# Patient Record
Sex: Male | Born: 1935 | Race: White | Hispanic: No | Marital: Married | Smoking: Never smoker
Health system: Southern US, Community
[De-identification: ages and names within clinical notes are randomized; demographics above are authoritative.]

## PROBLEM LIST (undated history)

## (undated) DIAGNOSIS — E039 Hypothyroidism, unspecified: Secondary | ICD-10-CM

## (undated) DIAGNOSIS — G4710 Hypersomnia, unspecified: Secondary | ICD-10-CM

## (undated) DIAGNOSIS — E785 Hyperlipidemia, unspecified: Secondary | ICD-10-CM

## (undated) DIAGNOSIS — R011 Cardiac murmur, unspecified: Secondary | ICD-10-CM

## (undated) DIAGNOSIS — R569 Unspecified convulsions: Secondary | ICD-10-CM

## (undated) DIAGNOSIS — I4891 Unspecified atrial fibrillation: Secondary | ICD-10-CM

## (undated) DIAGNOSIS — R413 Other amnesia: Secondary | ICD-10-CM

## (undated) DIAGNOSIS — G40209 Localization-related (focal) (partial) symptomatic epilepsy and epileptic syndromes with complex partial seizures, not intractable, without status epilepticus: Secondary | ICD-10-CM

## (undated) DIAGNOSIS — R233 Spontaneous ecchymoses: Secondary | ICD-10-CM

## (undated) DIAGNOSIS — R4701 Aphasia: Secondary | ICD-10-CM

## (undated) DIAGNOSIS — R238 Other skin changes: Secondary | ICD-10-CM

## (undated) DIAGNOSIS — M1990 Unspecified osteoarthritis, unspecified site: Secondary | ICD-10-CM

## (undated) DIAGNOSIS — R351 Nocturia: Secondary | ICD-10-CM

## (undated) DIAGNOSIS — R319 Hematuria, unspecified: Secondary | ICD-10-CM

## (undated) DIAGNOSIS — B004 Herpesviral encephalitis: Secondary | ICD-10-CM

## (undated) DIAGNOSIS — R0683 Snoring: Secondary | ICD-10-CM

## (undated) DIAGNOSIS — R410 Disorientation, unspecified: Secondary | ICD-10-CM

## (undated) HISTORY — PX: CATARACT EXTRACTION: SUR2

## (undated) HISTORY — DX: Hyperlipidemia, unspecified: E78.5

## (undated) HISTORY — DX: Other skin changes: R23.8

## (undated) HISTORY — DX: Unspecified osteoarthritis, unspecified site: M19.90

## (undated) HISTORY — DX: Other amnesia: R41.3

## (undated) HISTORY — DX: Spontaneous ecchymoses: R23.3

## (undated) HISTORY — DX: Hematuria, unspecified: R31.9

## (undated) HISTORY — DX: Cardiac murmur, unspecified: R01.1

## (undated) HISTORY — PX: PROSTATE SURGERY: SHX751

## (undated) HISTORY — DX: Herpesviral encephalitis: B00.4

## (undated) HISTORY — DX: Hypersomnia, unspecified: G47.10

## (undated) HISTORY — DX: Localization-related (focal) (partial) symptomatic epilepsy and epileptic syndromes with complex partial seizures, not intractable, without status epilepticus: G40.209

## (undated) HISTORY — DX: Disorientation, unspecified: R41.0

## (undated) HISTORY — DX: Unspecified convulsions: R56.9

## (undated) HISTORY — DX: Unspecified atrial fibrillation: I48.91

## (undated) HISTORY — DX: Aphasia: R47.01

---

## 1993-01-31 HISTORY — PX: JOINT REPLACEMENT: SHX530

## 1997-12-01 ENCOUNTER — Ambulatory Visit (HOSPITAL_COMMUNITY): Admission: RE | Admit: 1997-12-01 | Discharge: 1997-12-01 | Payer: Self-pay | Admitting: Internal Medicine

## 1998-01-16 ENCOUNTER — Ambulatory Visit (HOSPITAL_COMMUNITY): Admission: RE | Admit: 1998-01-16 | Discharge: 1998-01-16 | Payer: Self-pay | Admitting: *Deleted

## 2001-08-01 ENCOUNTER — Encounter (INDEPENDENT_AMBULATORY_CARE_PROVIDER_SITE_OTHER): Payer: Self-pay | Admitting: Specialist

## 2001-08-01 ENCOUNTER — Ambulatory Visit (HOSPITAL_COMMUNITY): Admission: RE | Admit: 2001-08-01 | Discharge: 2001-08-01 | Payer: Self-pay | Admitting: *Deleted

## 2003-02-03 ENCOUNTER — Ambulatory Visit: Admission: RE | Admit: 2003-02-03 | Discharge: 2003-02-03 | Payer: Self-pay | Admitting: Orthopedic Surgery

## 2003-03-03 ENCOUNTER — Inpatient Hospital Stay (HOSPITAL_COMMUNITY): Admission: RE | Admit: 2003-03-03 | Discharge: 2003-03-06 | Payer: Self-pay | Admitting: Orthopedic Surgery

## 2004-02-01 DIAGNOSIS — B004 Herpesviral encephalitis: Secondary | ICD-10-CM

## 2004-02-01 HISTORY — DX: Herpesviral encephalitis: B00.4

## 2004-02-26 ENCOUNTER — Inpatient Hospital Stay (HOSPITAL_COMMUNITY): Admission: EM | Admit: 2004-02-26 | Discharge: 2004-03-24 | Payer: Self-pay | Admitting: Emergency Medicine

## 2004-02-26 ENCOUNTER — Ambulatory Visit: Payer: Self-pay | Admitting: Internal Medicine

## 2004-02-26 ENCOUNTER — Ambulatory Visit: Payer: Self-pay | Admitting: Infectious Diseases

## 2004-03-01 ENCOUNTER — Ambulatory Visit: Payer: Self-pay | Admitting: Cardiology

## 2004-03-01 ENCOUNTER — Encounter: Payer: Self-pay | Admitting: Cardiology

## 2004-03-24 ENCOUNTER — Ambulatory Visit: Payer: Self-pay | Admitting: Physical Medicine & Rehabilitation

## 2004-03-24 ENCOUNTER — Inpatient Hospital Stay (HOSPITAL_COMMUNITY)
Admission: RE | Admit: 2004-03-24 | Discharge: 2004-04-02 | Payer: Self-pay | Admitting: Physical Medicine & Rehabilitation

## 2004-04-26 ENCOUNTER — Ambulatory Visit
Admission: RE | Admit: 2004-04-26 | Discharge: 2004-04-26 | Payer: Self-pay | Admitting: Physical Medicine & Rehabilitation

## 2004-04-26 ENCOUNTER — Ambulatory Visit (HOSPITAL_COMMUNITY)
Admission: RE | Admit: 2004-04-26 | Discharge: 2004-04-26 | Payer: Self-pay | Admitting: Physical Medicine & Rehabilitation

## 2004-05-07 ENCOUNTER — Encounter
Admission: RE | Admit: 2004-05-07 | Discharge: 2004-08-05 | Payer: Self-pay | Admitting: Physical Medicine & Rehabilitation

## 2004-05-10 ENCOUNTER — Encounter
Admission: RE | Admit: 2004-05-10 | Discharge: 2004-07-07 | Payer: Self-pay | Admitting: Physical Medicine & Rehabilitation

## 2004-05-11 ENCOUNTER — Ambulatory Visit: Payer: Self-pay | Admitting: Physical Medicine & Rehabilitation

## 2004-05-22 ENCOUNTER — Emergency Department (HOSPITAL_COMMUNITY): Admission: EM | Admit: 2004-05-22 | Discharge: 2004-05-23 | Payer: Self-pay | Admitting: Emergency Medicine

## 2004-07-09 ENCOUNTER — Ambulatory Visit: Payer: Self-pay | Admitting: Physical Medicine & Rehabilitation

## 2004-07-15 ENCOUNTER — Encounter: Admission: RE | Admit: 2004-07-15 | Discharge: 2004-07-15 | Payer: Self-pay | Admitting: Neurology

## 2006-03-27 ENCOUNTER — Ambulatory Visit (HOSPITAL_COMMUNITY): Admission: RE | Admit: 2006-03-27 | Discharge: 2006-03-27 | Payer: Self-pay | Admitting: Urology

## 2006-06-06 ENCOUNTER — Encounter (INDEPENDENT_AMBULATORY_CARE_PROVIDER_SITE_OTHER): Payer: Self-pay | Admitting: Specialist

## 2006-06-06 ENCOUNTER — Ambulatory Visit (HOSPITAL_COMMUNITY): Admission: RE | Admit: 2006-06-06 | Discharge: 2006-06-07 | Payer: Self-pay | Admitting: Urology

## 2006-09-01 ENCOUNTER — Inpatient Hospital Stay (HOSPITAL_COMMUNITY): Admission: AD | Admit: 2006-09-01 | Discharge: 2006-09-04 | Payer: Self-pay | Admitting: Neurology

## 2006-09-01 ENCOUNTER — Encounter: Payer: Self-pay | Admitting: Emergency Medicine

## 2006-09-27 IMAGING — CT CT HEAD W/O CM
1 of 2 series · 13 of 30 positions shown, 17 images · IV contrast (agent unspecified)
Comparison: none

CLINICAL DATA: confusion for two days; questionable TIAs; no previous history of seizure but did have a seizure today
 CT SCAN OF THE HEAD WITHOUT CONTRAST:
 A series of scans of the head without contrast show motion artifact.  No definite intracranial mass or hemorrhage is seen.  Ventricular system appears normal.  There is mild symmetrical atrophy.  Bone windows show no evidence of skull fracture or foreign body.  The paranasal sinuses appear to be within the limits of normal.  There appears to be a nasogastric tube in the right nasal passageway.

[Series 2: fast head · axial · 0.47mm/px · z∈[+145,+285]mm · 13 of 40 slices shown, 17 images]
[im 3/40  brain]
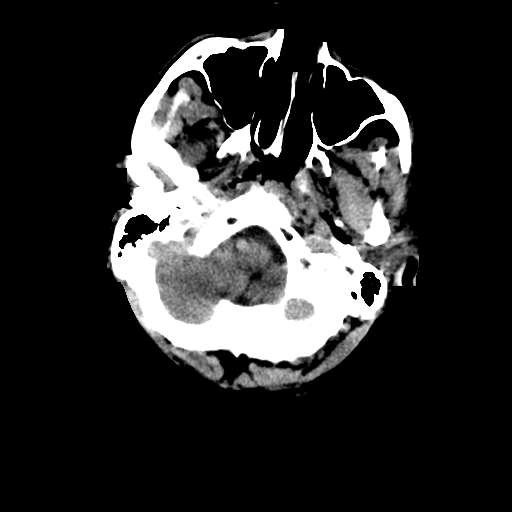
[im 3/40  bone]
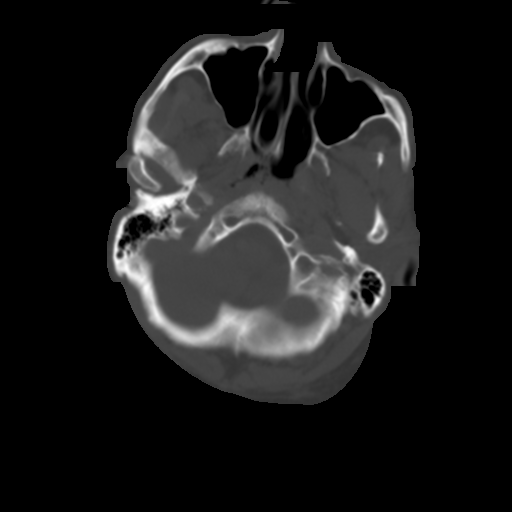
[im 6/40  brain]
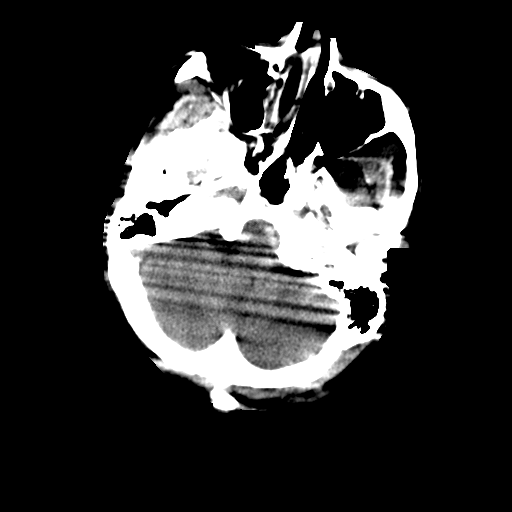
[im 9/40  brain]
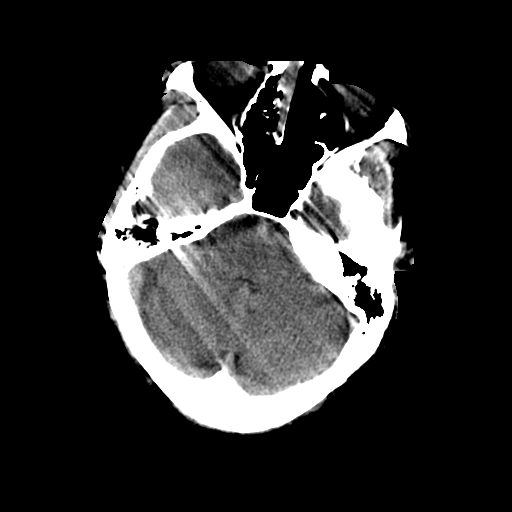
[im 12/40  brain]
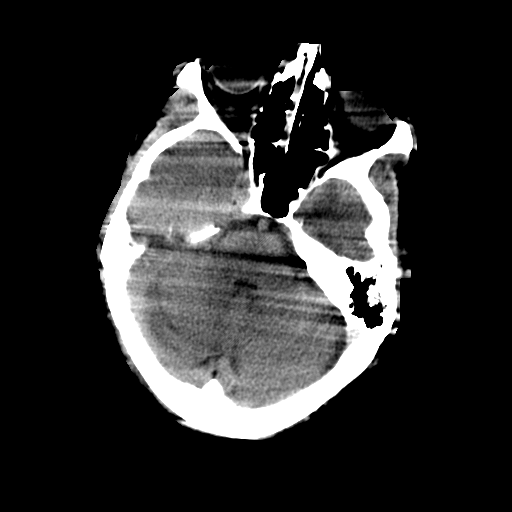
[im 14/40  brain]
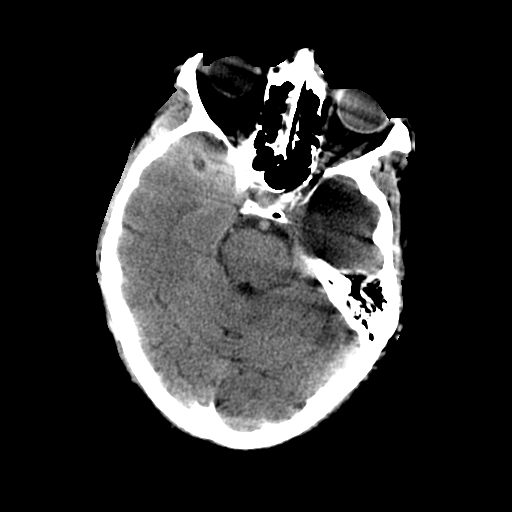
[im 14/40  bone]
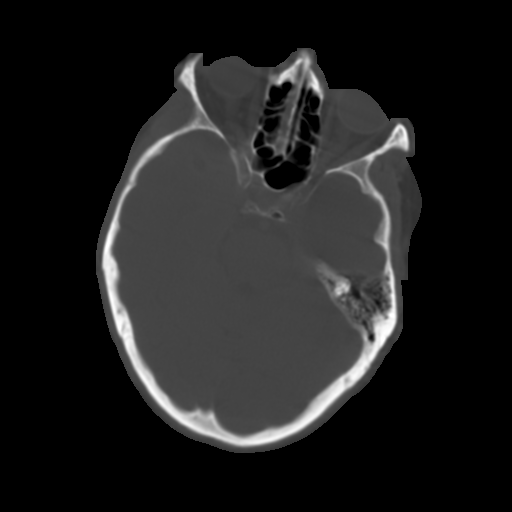
[im 17/40  brain]
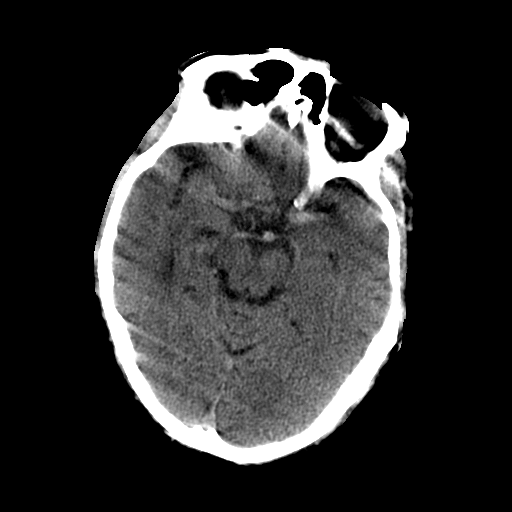
[im 20/40  brain]
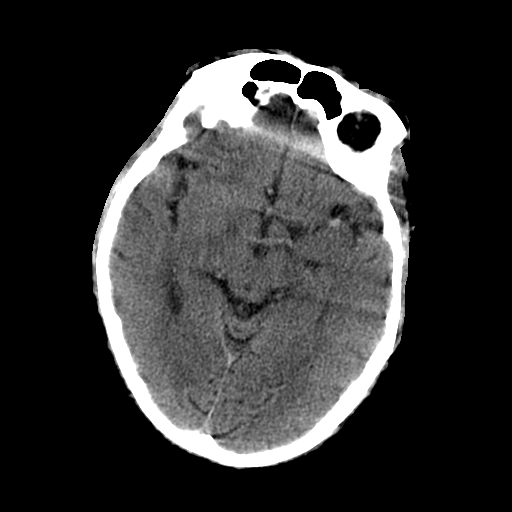
[im 23/40  brain]
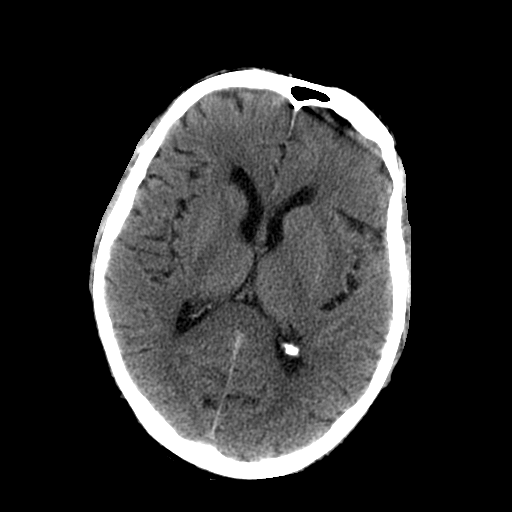
[im 26/40  brain]
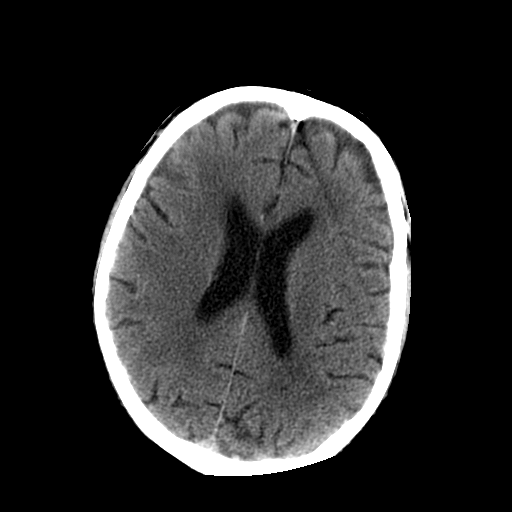
[im 26/40  bone]
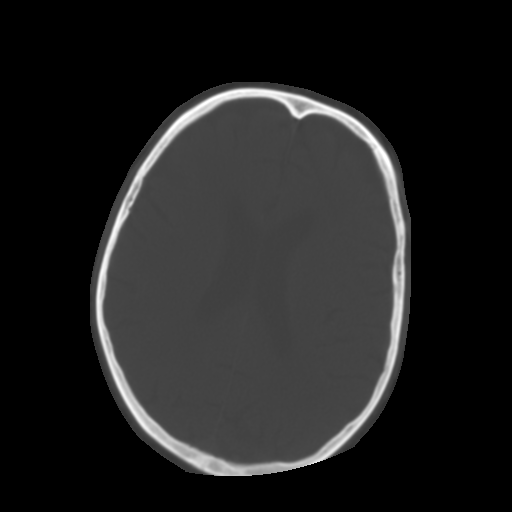
[im 28/40  brain]
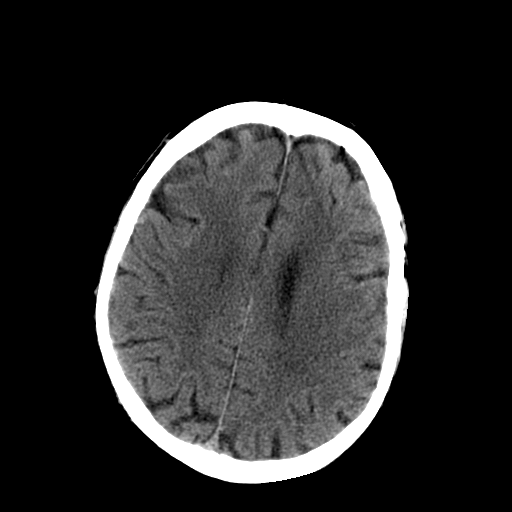
[im 31/40  brain]
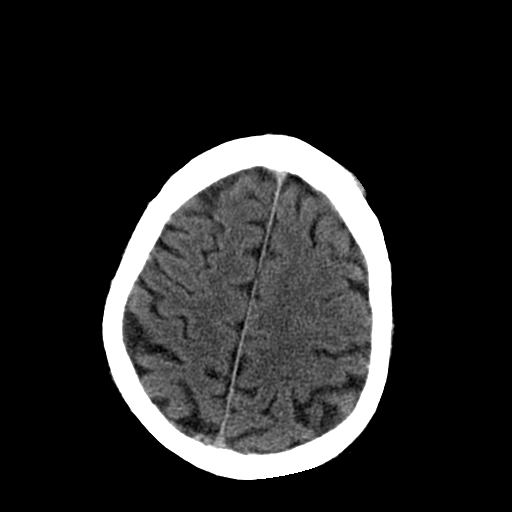
[im 34/40  brain]
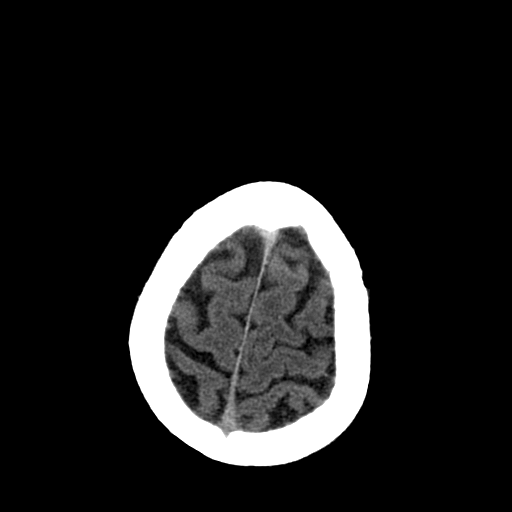
[im 37/40  brain]
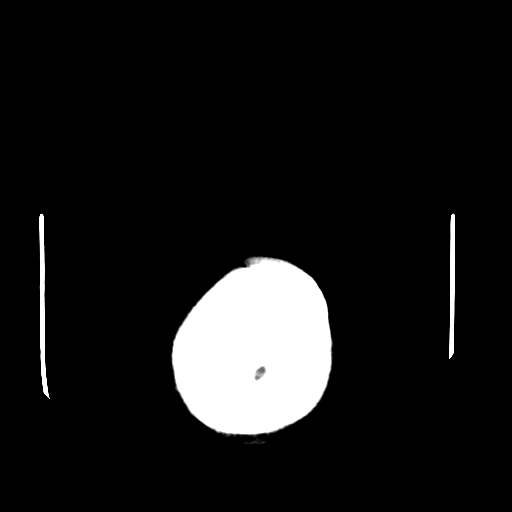
[im 37/40  bone]
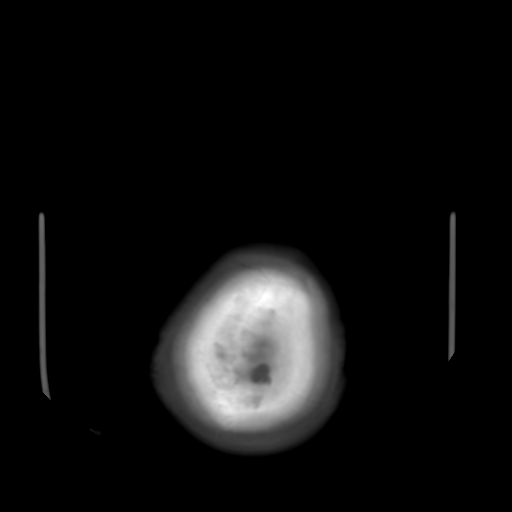

[13 of 30 positions shown; findings below may reference images not displayed]

IMPRESSION: Some motion artifact.  No acute intracranial findings.  Mild symmetrical atrophy.

## 2006-09-30 IMAGING — CT CT HEAD W/O CM
1 series · 16 of 30 positions shown, 20 images · IV contrast (agent unspecified)
Comparison: MR 02/27/2004.

CLINICAL DATA: Seizure.  
 HEAD CT WITHOUT CONTRAST:

[Series 2: trauma head · axial · 0.47mm/px · z∈[+119,+260]mm · 16 of 30 slices shown, 20 images]
[im 2/30  brain]
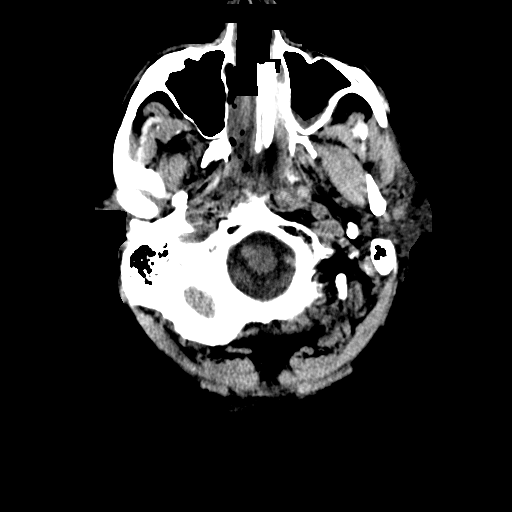
[im 2/30  bone]
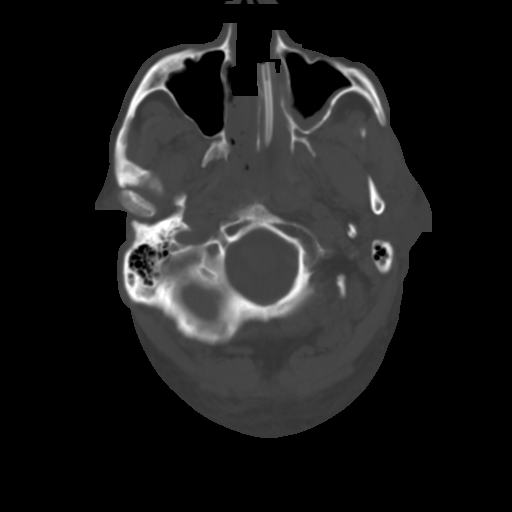
[im 4/30  brain]
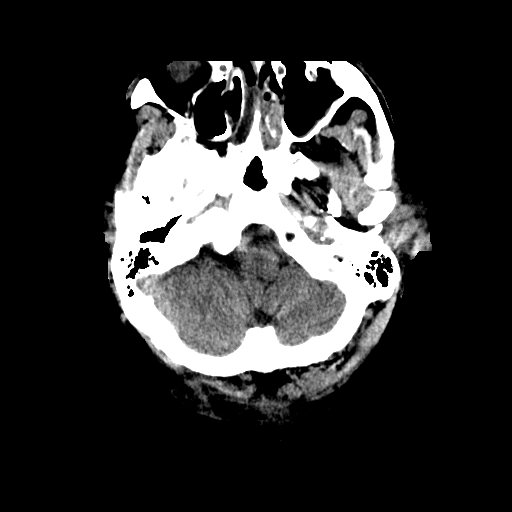
[im 6/30  brain]
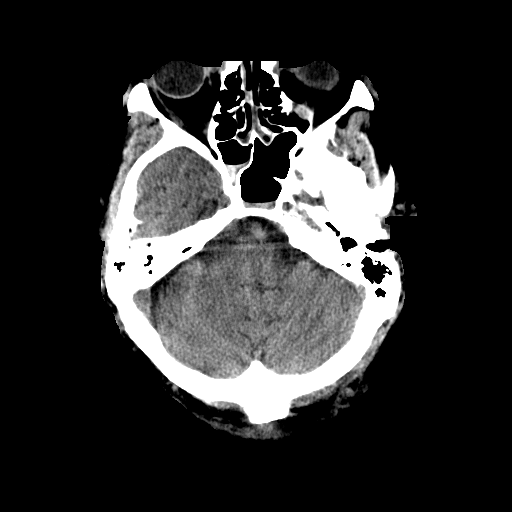
[im 8/30  brain]
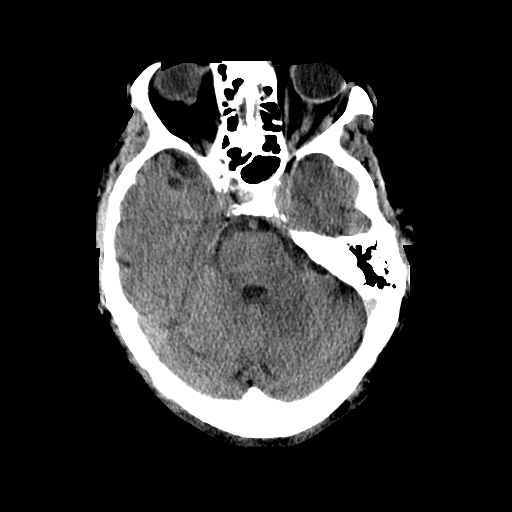
[im 9/30  brain]
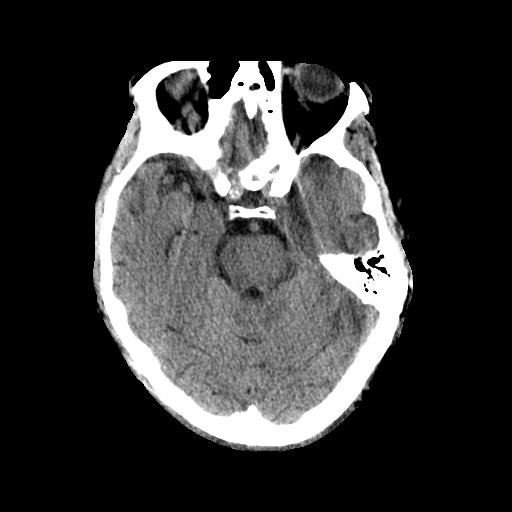
[im 9/30  bone]
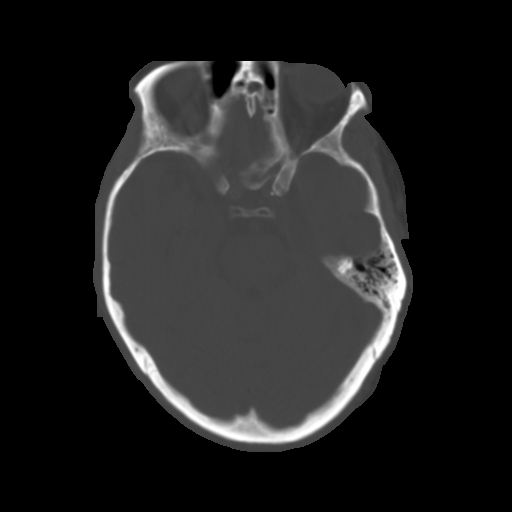
[im 11/30  brain]
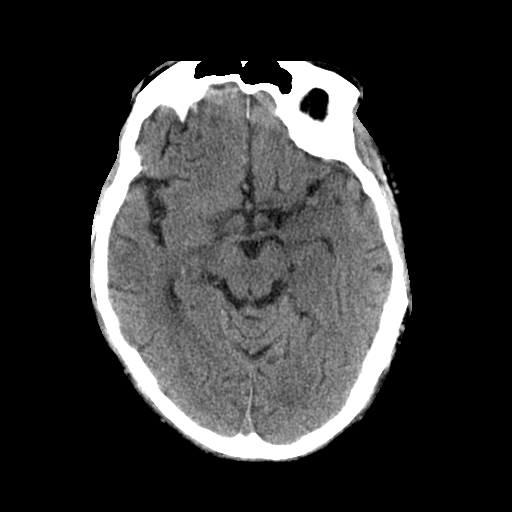
[im 13/30  brain]
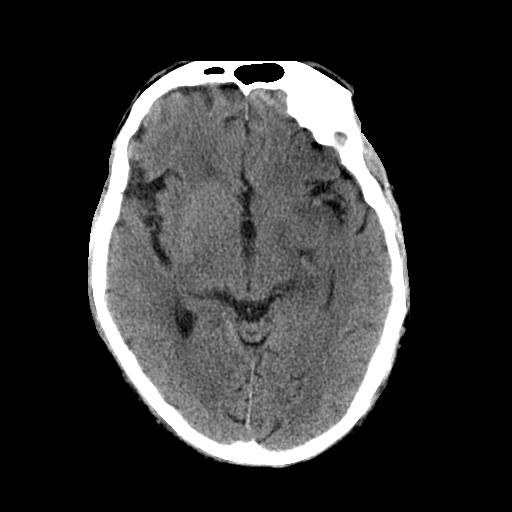
[im 15/30  brain]
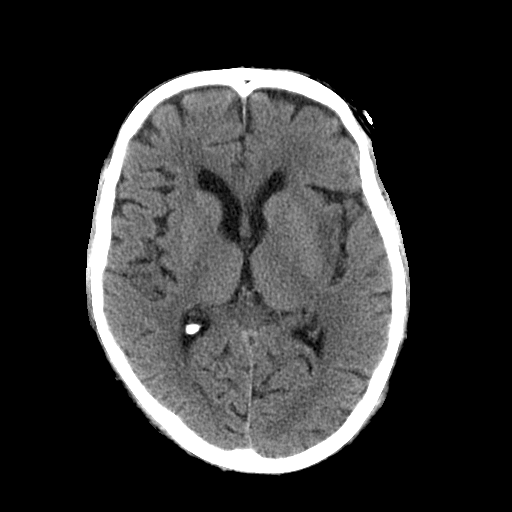
[im 16/30  brain]
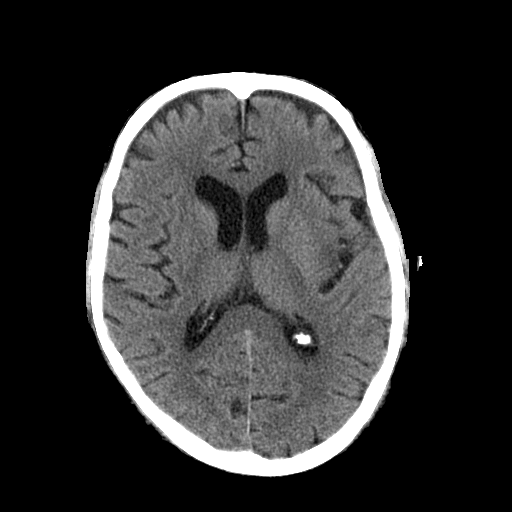
[im 16/30  bone]
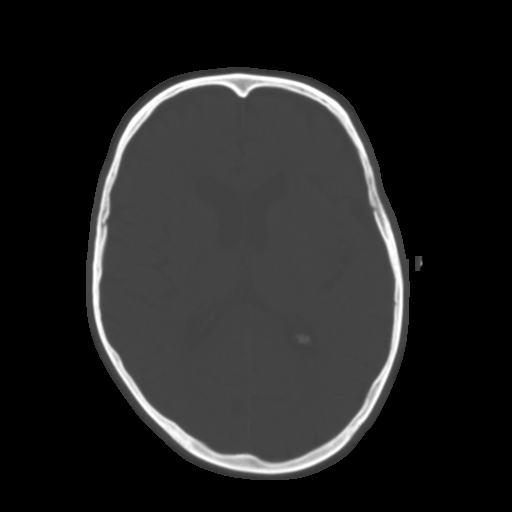
[im 18/30  brain]
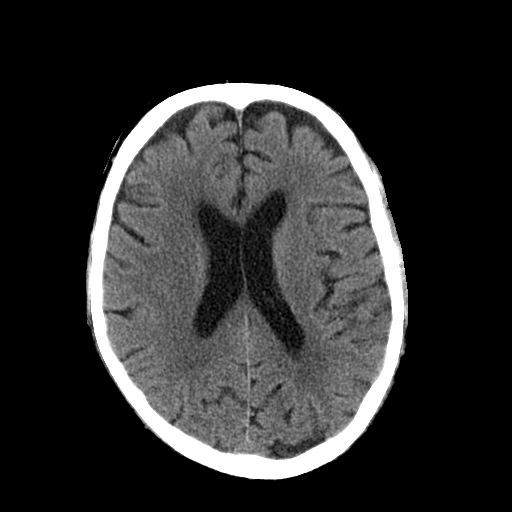
[im 20/30  brain]
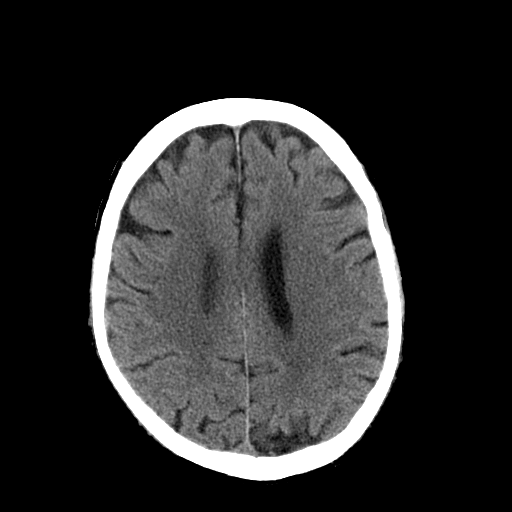
[im 22/30  brain]
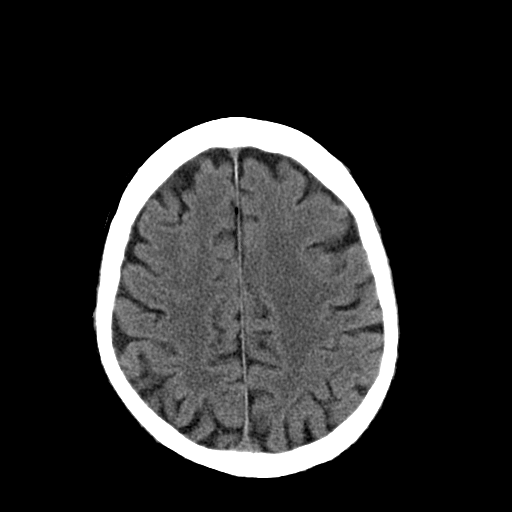
[im 23/30  brain]
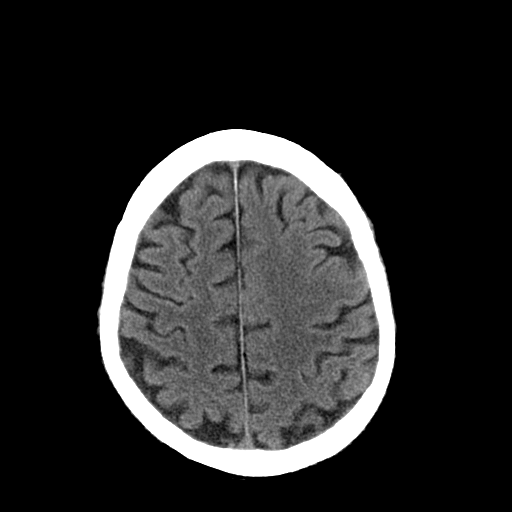
[im 23/30  bone]
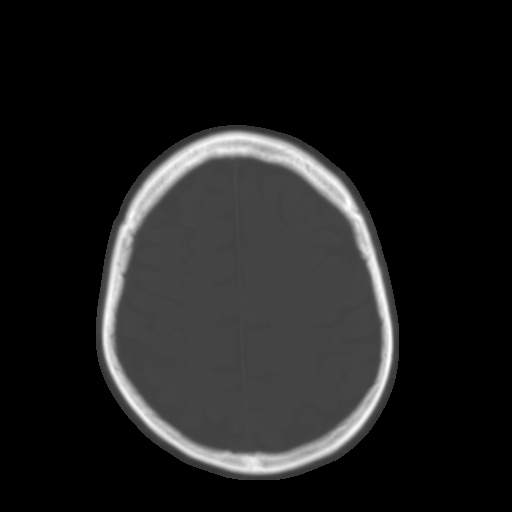
[im 25/30  brain]
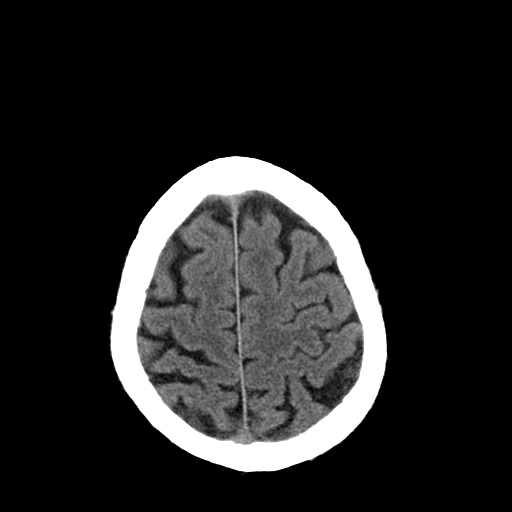
[im 27/30  brain]
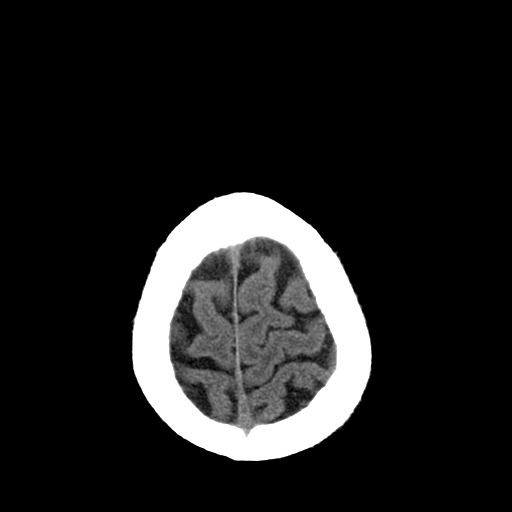
[im 29/30  brain]
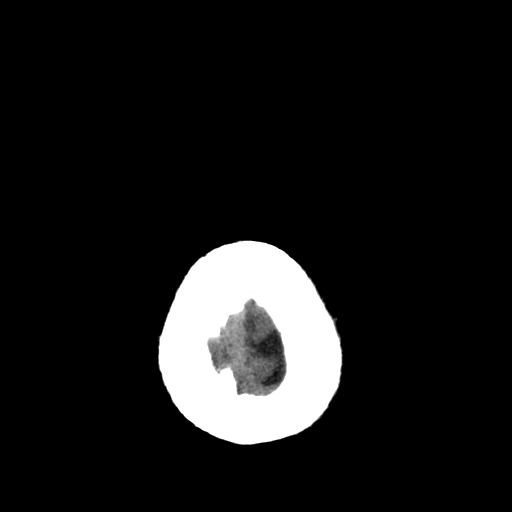

[16 of 30 positions shown; findings below may reference images not displayed]

FINDINGS: Low density in the medial left temporal lobe is again present.  Air fluid levels are noted in the sphenoid sinuses. There is no evidence of mass effect, midline shift or acute hemorrhage.  The ventricular system is within normal limits.
IMPRESSION: No significant interval change other than fluid in the sphenoid sinuses.  Edema in the medial left temporal lobe is stable.

## 2010-02-20 ENCOUNTER — Encounter: Payer: Self-pay | Admitting: Internal Medicine

## 2010-02-21 ENCOUNTER — Encounter: Payer: Self-pay | Admitting: Physical Medicine & Rehabilitation

## 2010-06-15 NOTE — Consult Note (Signed)
NAME:  Ronald Roberts, Ronald Roberts NO.:  [id number]   MEDICAL RECORD NO.:  [id number]          PATIENT TYPE:  INP   LOCATION:  3035                         FACILITY:  [facility]   PHYSICIAN:  Pramod P. Pearlean Brownie, MD    DATE OF BIRTH:  19-May-1922   DATE OF CONSULTATION:  DATE OF DISCHARGE:                                 CONSULTATION   ADDENDUM TO CONSULT NOTE:  Ronald Roberts came in with a seizure, was given 1.5 g of IV Fosphenytoin and  now following this the patient complained of nausea and was felt to be  dizzy and ataxic, and family was not comfortable taking him home and  requested that he be admitted for observation.  Hence the patient has  been transferred from a consult in the ER, then discharged home to  admission for observation.  I requested Dr. Otilio Carpen, ER physician, to  admit the patient as a direct admit to [facility] under my care.           ______________________________  Sunny Schlein. Pearlean Brownie, MD     PPS/MEDQ  D:  09/01/2006  T:  09/02/2006  Job:  [id number]

## 2010-06-15 NOTE — Consult Note (Signed)
NAME:  Ronald Roberts, FETCH NO.:  [id number]   MEDICAL RECORD NO.:  [id number]          Roberts TYPE:  EMS   LOCATION:  ED                           FACILITY:  [facility]   PHYSICIAN:  Pramod P. Pearlean Brownie, MD    DATE OF BIRTH:  [birth year]   DATE OF CONSULTATION:  DATE OF DISCHARGE:                                 CONSULTATION   REFERRING PHYSICIAN:  Dr. Valma Cava.   REASON FOR REFERRAL:  Seizure.   HISTORY OF PRESENT ILLNESS:  Mr. Ronald Roberts is an 77 year old pleasant  Caucasian male who had a witnessed generalized tonic-clonic seizure  earlier today at home.  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  Ronald  Roberts's wife, who was eyewitness, describes generalized tonic-clonic  activity lasting a few minutes with foaming at Ronald mouth as well as  tongue bite.  Ronald Roberts stopped seizing by Ronald time he was brought to  Ronald emergency room.  He has had no further seizures.  He was a little  bit confused for awhile but now is back to his neurological baseline.  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  At that time he was discharged on Depakote;  however, he remained seizure-free for almost a year and hence Depakote  was discontinued.  Ronald Roberts was seen by Dr. Orlin Hilding in our practice  for follow-up but has been seen only by Ronald primary physician for last  year and a half.  Ronald Roberts's wife is unable to recall any specific  triggers for Ronald present seizures in Ronald form of __________, ongoing  infection.  There is no other past significant history.   HOME MEDICATIONS:  None.   MEDICATION ALLERGIES:  Vancomycin.   SOCIAL HISTORY:  Ronald Roberts is married, lives with his wife in  [place].  Does not smoke or drink.   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  He needs assistance with most activities.  He  needs close supervision.  No recent fever, cough, shortness of breath,  diarrhea or other illness.   PHYSICAL EXAM:  A pleasant, elderly Caucasian male who is at present not  in distress.  Pulse rate 83 per minute, regular.  Respiratory rate 20 per minute.  Saturations 91% on room air.  Temperature 99.1.  Blood pressure 133/77.  Head is not traumatic.  Neck is supple without bruit.  ENT exam  unremarkable.  CARDIAC EXAM:  No murmur or gallop.  LUNGS:  Clear to auscultation.  ABDOMEN:  Soft, nontender.  NEUROLOGICAL EXAM:  Ronald Roberts is awake, alert, oriented x3 with normal  speech and language function.  There is no aphasia, apraxia or  dysarthria.  Pupils are equal, reactive, and movements are full-range  without nystagmus.  Ronald Roberts has receptive aphasia.  He is fluent  __________ simple conversation but  can follow only gestures and a few  one-step commands.  He is able to name and repeat quite well.  Face is  symmetric.  Palatal movements are normal.  Tongue is midline.  Motor  system exam reveals no upper extremity drift, symmetric strength, tone,  reflexes, coordination, sensation.  Gait was not tested.   DATA REVIEWED:  CT scan of Ronald head shows no acute abnormality.  WBC  count is normal.  Myoglobin is elevated but troponin is normal.   IMPRESSION:  An 77 year old gentleman with breakthrough generalized  tonic-clonic seizure following previous history of symptomatic seizures  from herpes simplex encephalitis.   PLAN:  I would load Ronald Roberts with IV fosphenytoin 20 mg/kg, followed  by 300 a day.  Check level in 10 days.  Ronald Roberts needs outpatient to  continue in follow-up with Dr. Orlin Hilding in a month as well as outpatient  EEG.  I had long discussion with Ronald Roberts's wife and daughter and  explained Ronald plan for treatment and answered questions.  I have advised  him to call  me if Ronald Roberts has further seizures or side effects from  Dilantin.           ______________________________  Sunny Schlein. Pearlean Brownie, MD     PPS/MEDQ  D:  09/01/2006  T:  09/01/2006  Job:  [id number]   cc:   Santina Evans A. Orlin Hilding, M.D.  Fax: [phone]   Antony Madura, M.D.  Fax: [phone]

## 2010-06-18 NOTE — Op Note (Signed)
NAME:  ROONEY, SWAILS NO.:  [id number]   MEDICAL RECORD NO.:  [id number]          PATIENT TYPE:  INP   LOCATION:  1824                         FACILITY:  [facility]   PHYSICIAN:  Gustavus Messing. Orlin Hilding, M.D.DATE OF BIRTH:  [birth year]   DATE OF PROCEDURE:  DATE OF DISCHARGE:                                 OPERATIVE REPORT   PROCEDURE:  Lumbar puncture.   ATTENDING:  Gustavus Messing. Orlin Hilding, M.D.   INDICATION:  Rule out meningitis.   INFORMED CONSENT:  Procedure was explained to the family and consent was  obtained.   DESCRIPTION OF PROCEDURE:  The patient was sterilely prepped and draped and  positioned in the left lateral decubitus position.  L4-5 level was  identified.  He was locally anesthetized with 1% Xylocaine.  The thecal sac  was entered without difficulty with an opening pressure of 19 Torr.  Approximately 12 mL of clear colorless fluid were obtained and sent to the  lab for studies, bacteria, cell count and protein and glucose, etc.  The  patient seemed to tolerate the procedure well with no immediate  complications.      CAW/MEDQ  D:  02/26/2004  T:  02/27/2004  Job:  [id number]

## 2010-06-18 NOTE — Consult Note (Signed)
NAME:  Ronald Roberts, Ronald Roberts NO.:  [id number]   MEDICAL RECORD NO.:  [id number]          PATIENT TYPE:  INP   LOCATION:  1824                         FACILITY:  [facility]   PHYSICIAN:  Gustavus Messing. Orlin Hilding, M.D.DATE OF BIRTH:  14-Nov-1922   DATE OF CONSULTATION:  02/26/2004  DATE OF DISCHARGE:                                   CONSULTATION   REFERRING PHYSICIAN:  Dr. Nyoka Cowden.   CHIEF COMPLAINT:  Seizure, fever, confusion.   HISTORY OF PRESENT ILLNESS:  Ronald Roberts is an 77 year old generally healthy  white male.  Four days ago, he complained of feeling chilled and unwell, no  fever.  Three days ago, he began having some speech difficulties with  deliberate slow speech, hard to get his thoughts out.  Two days ago, this  was worse and saw his primary physician.  He did seem confused as well as  having trouble speaking, otherwise a nonfocal exam.  Yesterday, he was worse  and he was scheduled to have an MRI of the brain today, but he got worse yet  today with speech problems and confusion, no weakness, but noted to be  quivery on the right hand and chin; his wife brought him to the emergency  room.  He had a generalized tonic-clonic seizure in triage and required  intubation.   REVIEW OF SYSTEMS:  Review of systems according to the wife only as above.  No complaints of headache, weakness or visual symptoms.   PAST MEDICAL HISTORY:  Past medical history is significant for:  1.  Right hip replacement.  2.  Old prostate surgery.  3.  He has a hernia that he treats with a truss.  4.  No coronary artery disease, no hypertension, no diabetes, no      hyperlipidemia.  5.  Possibly had some shingles in the past.  6.  He does have a rash on his abdomen currently.   MEDICATIONS:  None except for vitamins.   ALLERGIES:  No known drug allergies.   SOCIAL HISTORY:  He is married, still works as a Human resources officer, no  cigarettes, no alcohol.   FAMILY HISTORY:  His  family history is negative for seizure.   PHYSICAL EXAMINATION:  VITAL SIGNS:  Temperature is 100.2, respirations 18,  pulse 83, blood pressure 169/90.  HEENT:  Head is normocephalic, atraumatic.  NECK:  Neck is supple without any rigidity or bruits.  HEART:  Regular rate and rhythm.  NEUROLOGIC:  He has been intubated and sedated, but he will open his eyes,  nod his head, try to follow commands with all 4 extremities.  Cranial  nerves:  His pupils are equal and reactive.  He does blink to threat on both  sides.  Extraocular movements are intact.  There is no clear facial  asymmetry.  He is intubated.  Motor exam:  He does move all 4 extremities  spontaneously and to command.  Reflexes are 1+ with downgoing toes.  Coordination:  I am not able to assess.  Sensory:  He does grimace and  withdraw to painful stimulation.  IMAGING STUDIES:  CT of the brain was negative for acute abnormalities.   LABORATORIES:  White blood cell count is 13.3, otherwise fairly normal.  Potassium is slightly elevated at 5.7.  He does have a rash noted on his  abdomen prior to the institution of any medication such as Dilantin.   IMPRESSION:  Progressive confusion and language deficit for 4 days with  fever, now culminating with seizure; rule out encephalitis or meningitis.  I  am concerned about herpes encephalitis.   PLAN:  We will perform LP now, get an EEG and MRI with and without contrast.  We need to cover him with acyclovir and broad-spectrum antibiotics, and load  with Dilantin.      CAW/MEDQ  D:  02/26/2004  T:  02/27/2004  Job:  [id number]

## 2010-06-18 NOTE — Assessment & Plan Note (Signed)
MEDICAL RECORD NUMBER:  [id number].   HISTORY:  Ronald Roberts is an 77 year old male with a history of herpes  encephalitis and was admitted to [facility] February 26, 2004. Was treated with a four-day history of malaise, speech problems, and  mental status changes. He had a tonic/clonic seizure and required  intubation. He was loaded with Dilantin. He was switched to Depakote for  seizure prophylaxis. He was at rehab from March 24, 2004 to April 02, 2004. He was discharged to his family and has been receiving home health  therapy for four weeks and now has converted to speech therapy. He does have  24-hour supervision. His family states that he mainly has speech problems.  His wife initially had him on one quarter of a dosage of Depakote that was  prescribed, i.e. 125 q.i.d. He has had no seizure activity. They feel since  he has been taking the higher dose he has been a bit slower cognitively.   No falls.   CURRENT MEDICATIONS:  1.  Depakote as noted.  2.  MVI 1 p.o. q.d.   PAST MEDICAL HISTORY:  Significant for DJD.   No pain complaints.   PHYSICAL EXAMINATION:  VITAL SIGNS:  Blood pressure 149/88, pulse 71,  respirations 16, O2 saturation 96% on room air.  GENERAL:  No acute distress. Mood and affect appropriate; however, his  orientation resolved. He has severe naming problems. He also appears to  misinterpret speech but is able to follow commands with gestural cues. He is  able to name a pen but was unable to name ring or watch and tends to  perseverate on pen. He is able to repeat easily, and his speech is generally  fluent.   His motor strength is 5/5 bilateral upper and lower extremities. His gait is  slightly wide based but otherwise no evidence of toe drag or knee  instability. Cranial nerves II-XII are intact.   IMPRESSION:  Herpes encephalitis. Had dysphagia. This has really improved  back to a normal diet. Had gait imbalance, and this has improved as  well. No  falls and ambulates without a device. His main problems at this point are a  mixed receptive and expressive aphasia which I think is accounting for most  of his apparent cognitive problems.   PLAN:  1.  The patient will continue outpatient speech therapy.  2.  He will see Dr. Orlin Roberts who will address Depakote duration as well as      dosage. He will likely need a Depakote level when he sees Dr. Orlin Roberts.   I will see the patient back in one month to followup on therapy progress.      AEK/MedQ  D:  05/11/2004 11:13:44  T:  05/11/2004 11:50:54  Job #:  [id number]   cc:   Ronald Roberts, M.D.  [address]  [address]  [place]  [place] [address]  Fax: [phone]   Ronald Roberts, M.D.  [address]  [place]  [place] [address]  Fax: [phone]

## 2010-06-18 NOTE — Procedures (Signed)
CHIEF COMPLAINT:  Obtundation and coma. Study is being done to look for the  presence of seizures. The patient has had semirhythmic eye blinking.   PROCEDURE:  The tracing is carried out on a 32-channel digital Cadwell  recorder reformatted into 16-channel montages with one devoted to EKG. The  patient was lethargic and poorly responsive during this study. Medications  include insulin, Seroquel, Klonopin, Protonix, Dilantin, Depakote, Fortaz,  Ativan, Haldol, patient has been on acyclovir as well.   PROCEDURE:  Tracing is carried out on a 32-channel digital Cadwell recorder  portably in the intensive care unit. The International 10/20 system lead  placement used. This study was compared with the March 08, 2004 study.   DESCRIPTION OF FINDINGS:  The background is a 5 Hz 10-40 microvolt beta  range activity with superimposed polymorphic 40 microvolt 2-3 Hz delta range  components.   The patient drifts into sleep with vertex sharp waves and spindle-like  activity from 8-11 Hz in the central regions. There was no focal slowing.  There was no interictal epileptiform activity in the form of spikes or sharp  waves. I lead montages showed both eye blink artifact and also frontal  slowing. EKG showed a regular sinus rhythm with ventricular response of 96  beats per minute.   The background was nonreactive to eye opening.   In comparison with the March 08, 2004 record, there was higher amplitude,  greater diversity of frequency frequencies represented and also no  interictal seizure activity.      HQI:ONGE  D:  03/13/2004 09:07:26  T:  03/13/2004 19:29:45  Job #:  [id number]   cc:   Santina Evans A. Orlin Hilding, M.D.  [address]  [place]  [place] [address]  Fax: [phone]   Charlaine Dalton. Sherene Sires, M.D. [facility]

## 2010-06-18 NOTE — H&P (Signed)
NAME:  Ronald Roberts, Ronald Roberts NO.:  [id number]   MEDICAL RECORD NO.:  [id number]                   PATIENT TYPE:   LOCATION:                                       FACILITY:   PHYSICIAN:  Almedia Balls. Ranell Patrick, M.D.              DATE OF BIRTH:   DATE OF ADMISSION:  02/05/2003  DATE OF DISCHARGE:                                HISTORY & PHYSICAL   CHIEF COMPLAINT:  Right hip pain.   HISTORY OF PRESENT ILLNESS:  Ronald Roberts is a 77 year old male with a history  of right hip pain.  States that he is having pain on and off.  He is now at  the point where he wishes to have something definitively done to his right  hip because he it has decreased his activities of daily living.  X-rays  revealed severe osteoarthritis of the right hip.  At this point Dr. Ranell Patrick  feels it is best to proceed with a right total hip replacement and the  patient agrees.  Risks and benefits of the surgery were discussed with  patient.  The patient wishes to proceed.   PAST MEDICAL HISTORY:  1. Osteoarthritis.  2. BPH.   PAST SURGICAL HISTORY:  Transurethral resection of the prostate.   MEDICATIONS:  1. Aspirin one p.o. daily.  2. Glucosamine daily.   ALLERGIES:  No known drug allergies.   SOCIAL HISTORY:  The patient denies any tobacco or alcohol use.  He is  married.  He lives in a [place] house with two steps entering the house.  His wife will be his caregiver after surgery.   FAMILY HISTORY:  Father deceased with hypertension.   REVIEW OF SYSTEMS:  GENERAL:  Denies fever, chills, night sweats, bleeding  tendencies.  CNS:  Denies blurry/double vision, seizures, headaches,  paralysis.  RESPIRATORY:  Denies shortness of breath, productive cough, or  hemoptysis.  CARDIOVASCULAR:  Denies chest pain, angina, orthopnea.  GASTROINTESTINAL:  Denies nausea, vomiting, diarrhea, constipation, melena,  bloody stools.  GENITOURINARY:  Denies dysuria, hematuria, discharge.  MUSCULOSKELETAL:   Pertinent to HPI.   PHYSICAL EXAMINATION:  VITAL SIGNS:  Temperature 98.5, pulse 76,  respirations 18, blood pressure 114/66.  GENERAL:  Well-developed, well-nourished 77 year old male.  HEENT:  Normocephalic, atraumatic.  Pupils are equal, round, and reactive to  light.  NECK:  Supple.  No carotid bruit noted.  CHEST:  Clear to auscultation without rales, wheezes, crackles.  HEART:  Regular rate and rhythm.  No murmurs, rubs, or gallops.  ABDOMEN:  Soft, nontender, nondistended.  Positive bowel sounds x4.  EXTREMITIES:  The patient has limited internal rotation of the right hip.  He is able to flex to 90 degrees with pain of the right hip.  He walks with  a flat antalgic gait.  He is neurovascularly intact distally.  SKIN:  No rashes or lesions.   LABORATORIES:  X-ray reveals severe osteoarthritic  changes of the right hip.   IMPRESSION:  1. Osteoarthritis of the right hip.  2. Benign prostatic hypertrophy.   PLAN:  The patient will be admitted to [facility] on February 05, 2003 and undergo a right total hip arthroplasty by Dr. Malon Kindle.     Clarene Reamer, P.A.-C.                   Almedia Balls. Ranell Patrick, M.D.    SW/MEDQ  D:  01/30/2003  T:  01/30/2003  Job:  [id number]   cc:   Antony Madura, M.D.  [address]  [place]  [place] [address]  Fax: [phone]

## 2010-06-18 NOTE — Assessment & Plan Note (Signed)
MEDICAL RECORD NUMBER:  [id number]   DATE OF BIRTH:  [birth year]   DATE OF SERVICE:  Jun 10, 2004   INTERVAL HISTORY:  [age]-year-old male with history of herpes encephalitis  admitted to [facility] February 26, 2004.  He had a tonic  clonic seizure, required intubation, he was loaded with Dilantin, switched  to Depakote.  He was at rehab from February 22 to April 02, 2004 discharged  with his family receiving home health at first and then converted to  outpatient speech therapy.  He no longer has required PT/OT.  He has had his  Depakote adjusted.  He was noted to have a UTI in the month of April and was  treated for this.   PAST MEDICAL HISTORY:  Past medical history significant for DJD.   EXAMINATION:  GENERAL:  No acute distress.  Mood and affect appropriate.  Orientation is to self however, severe problems with naming tasks.  He  follows gestural cues, able to follow commands for physical exam but he is  unable to name simple objects such as ring, watch and perseverated on the  word [employer]x.  His speech is fluent and he is able to repeat.  His motor  strength is 5/5 bilateral upper and lower extremities.  Gait is slightly  widened but no evidence of toe drag or knee instability.  Cranial nerves II-  XII intact.   IMPRESSION:  Herpes encephalitis with dysphasia which has improved.  His  gait and balance has improved.  His main problem is mixed expressive and  receptive aphasia.   PLAN:  We will continue outpatient speech therapy.  I will see him in 1  month to monitor progress.      AEK/MedQ  D:  06/10/2004 17:31:29  T:  06/10/2004 21:28:13  Job #:  [id number]   cc:   Antony Madura, M.D.  [address]  [place]  [place] [address]  Fax: [phone]   Gustavus Messing. Orlin Hilding, M.D.  [address]  [place]  [place] [address]  Fax: [phone]

## 2010-06-18 NOTE — Procedures (Signed)
[facility]  Patient:    Ronald Roberts, Ronald Roberts Visit Number: [id number] MRN: [id number]          Service Type: END Location: ENDO Attending Physician:  Sabino Gasser Dictated by:   Sabino Gasser, M.D. Proc. Date: 08/01/01 Admit Date:  08/01/2001 Discharge Date: 08/01/2001   CC:         Antony Madura, M.D.   Procedure Report  PROCEDURE:  Colonoscopy with biopsy.  INDICATIONS:  Colon polyp by history in the past, removed by Dr. Su Hilt.  PREP:  Visicol tablets.  Result was very good.  ANESTHESIA:  Demerol 50, Versed 6 mg.  DESCRIPTION OF PROCEDURE:  With the patient mildly sedated in the left lateral decubitus position, a rectal exam was performed which was unremarkable. Subsequently, the Olympus videoscopic colonoscope was inserted into the rectum and passed under direct vision to the cecum, identified by the ileocecal valve and appendiceal orifice, both of which were photographed.  In the cecum was a polyp approximately 1 cm in size.  It was sessile; it was photographed, and it was removed using hot biopsy forceps technique setting of 20-20 blended current.  The tissue that was not removed was eradicated with the same setting.  From this point, the colonoscope was then slowly withdrawn, taking circumferential views of the entire colonic mucosa, stopping a few folds removed, where a similar-sized lipoma was seen and photographed only, and we further withdrew the colonoscope, taking circumferential views of the remaining colonic mucosa, stopping then only in the rectum which appeared normal on direct and showed hemorrhoids on retroflexed view.  The endoscope straightened and withdrawn.  The patients vital signs and pulse oximeter remained stable.  The patient tolerated the procedure well without apparent complications.  FINDINGS: 1. Polyp of cecum removed. 2. Await biopsy report. 3. The patient will call me for results and follow up with me as an  outpatient 4. Incidental finding of lipoma in the right colon as well.  PLAN:  As above. Dictated by:   Sabino Gasser, M.D. Attending Physician:  Sabino Gasser DD:  08/01/01 TD:  08/04/01 Job: [id number]

## 2010-06-18 NOTE — Op Note (Signed)
NAME:  Ronald Roberts, Ronald Roberts NO.:  [id number]   MEDICAL RECORD NO.:  [id number]                   PATIENT TYPE:  INP   LOCATION:  0453                                 FACILITY:  [facility]   PHYSICIAN:  Almedia Balls. Ranell Patrick, M.D.              DATE OF BIRTH:  July 28, 1922   DATE OF PROCEDURE:  03/03/2003  DATE OF DISCHARGE:                                 OPERATIVE REPORT   PREOPERATIVE DIAGNOSIS:  Right hip osteoarthritis.   POSTOPERATIVE DIAGNOSIS:  Right hip osteoarthritis.   OPERATION/PROCEDURE:  Right total hip replacement using DePuy S-ROM  prosthesis.   SURGEON:  Almedia Balls. Ranell Patrick, M.D.   ASSISTANT:  Donnie Coffin. Dixon, P.A.-C.   ANESTHESIA:  Spinal with MAC.   ESTIMATED BLOOD LOSS:  500 mL.   FLUIDS REPLACED:  2500 mL crystalloid.   URINE OUTPUT:  550 mL.   COMPLICATIONS:  None.   MEDICATIONS:  Preoperative antibiotics given.   INDICATIONS:  The patient is a 77 year old male who presents with end-stage  osteoarthritis of the right hip.  The patient complains of activity-related  pain including rest pain and night pain.  The patient's mobility has been  restricted secondary to his arthrosis as documented on plain x-ray.  The  patient has failed conservative management consisting of activity  modifications, anti-inflammatory medications over the course of several  years.  The patient now presents desiring total hip replacement.   DESCRIPTION OF PROCEDURE:  After adequate level of anesthesia achieved, the  patient was positioned in the left lateral decubitus position, the right hip  up.  After sterile prep and drape of the right leg, longitudinal skin  incision was created centered on the posterior two-thirds of trochanter  extending proximally, approximately 7-8 cm incision.  Dissection was carried  out sharply down through the subcutaneous tissues to tensor fascia lata  which was incised in line with the skin incision exposing the short rotators  which were taken off the posterior aspect of the hip capsule.  Gluteus  medius was retracted.  Once the capsule was exposed, the T-capsulotomy was  performed exposing the femoral head and neck.  The hip was progressively  internally rotated and dislocated.  At this point using the greater  trochanter as a reference for the hip center and a trial component, the neck  cut was made perpendicular to the long axis of the femur and appropriate  level of resection.  The head and neck was removed.  At this point attention  was directed towards the socket.  The labrum was removed with sharply from  the socket exposing the true acetabulum.  The ligament of teres was also  removed and all soft tissues removed from the fovea.  Sequential reaming was  performed up to a size 55 reamer to accept a 56 cup.  Excellent bite was  achieved with the 55 and again we medialized initially down onto the  tear  drop and then placed the reamer in the appropriate anteverted and horizontal  coverage position to where it was fully seated.  Reaming was done down to  the floor of the fovea and good bleeding bone was encountered all the way  around with good coverage.  At this point a 91 [address] [place]inn[place] cup was  impacted in place with appropriate version and coverage and then the  [place] anti version guide was placed into the socket, demonstrating  approximately 30 degrees of anteversion and 40 degrees horizontal coverage.  This was felt to be adequate.  A trial 0 liner was inserted and screwed into  place.  At this point the hip was extended and then internally rotated to  facilitate exposure of the femoral metaphysis.  Excess bone was removed on  the lateral side adjacent to the greater trochanter, facilitating access to  the intramedullary canal.  First a Star reamer was used to ream out that  lateral bone.  Next, a handheld T reamer was introduced into the  intramedullary canal and then thoroughly irrigated and  suctioned.  Next,  sequential reamers were used to ream up to a size 13.5 with excellent  purchase.  Next, the metaphyseal reamers were used and these were reamed up,  first to B and D size reamer.  This seemed to have appropriate anterior  posterior size and fit down to the appropriate level and greater trochanter.  Next, the neck reamer was used to ream out to a 18-D large and then the  trial sleeve was placed in the appropriate position.  The neck cut was  freshened up to make it perpendicular with the sleeve.  At this point the  trial 18.13, 36+8 stem was placed with 20 degrees of anteversion dialed in  and a 36 head ball.  At this point the hip was reduced.  Excellent stability  was noted and there was no impingement on external rotation and extension  nor on hip flexion and internal rotation with excellent stability.  At this  point the trial components were removed.  The 56 outer diameter, 36 inner  diameter polyethylene cup was inserted.  We had placed one screw up into the  ilium prior to that and also placed a manhole cover.  Next a 56 outer  diameter, 36 inner diameter polyethylene 0 neutral liner was impacted in  place.  Next, the 18-D large proximal sleeve was impacted into place and  then after thorough irrigation of the canal, the 18.13, 36+8 S-ROM  prosthesis was inserted with 20 degrees of anteversion and we did trial with  +3 head ball ___________ more femoral length and a little bit more soft  tissue tension.  This felt right with full trial.  This trial head was  removed and the +3 mm 36 outer diameter ball was impacted into place.  Hip  socket was checked for any soft tissue or debris.  None was encountered and  then the hip was relocated and taken through a full range of motion.  Excellent stability achieved.  The posterior capsule was repaired using  interrupted Ethibond suture, figure-of-eight.  This was #1 Ethibond. Following this, we replaced the piriformis back in  the gluteus medius tendon  adjacent to the greater trochanter.  Following this, the drain was placed  deep in the tensor fascia lata.  The tensor fascia lata was closed using  interrupted #1 Vicryl suture in a figure-of-eight fashion followed by 2-0  Vicryl for deep subcutaneous  tissue and superficial subcutaneous tissue and  a 4-0 running Monocryl for the skin.  Steri-Strips were placed followed by a  sterile dressing.  The patient was placed in a knee immobilizer and taken to  the recovery room in stable condition.                                               Almedia Balls. Ranell Patrick, M.D.    SRN/MEDQ  D:  03/03/2003  T:  03/03/2003  Job:  [id number]

## 2010-06-18 NOTE — Op Note (Signed)
NAME:  Ronald Roberts, Ronald Roberts NO.:  [id number]   MEDICAL RECORD NO.:  [id number]          PATIENT TYPE:  AMB   LOCATION:  DAY                          FACILITY:  [facility]   PHYSICIAN:  Excell Seltzer. Annabell Howells, M.D.    DATE OF BIRTH:  07-13-1922   DATE OF PROCEDURE:  06/06/2006  DATE OF DISCHARGE:                               OPERATIVE REPORT   PROCEDURE:  Transurethral resection of the prostate.   PREOPERATIVE DIAGNOSIS:  Benign prostatic hypertrophy with outlet  obstruction and hematuria.   POSTOPERATIVE DIAGNOSIS:  Benign prostatic hypertrophy with outlet  obstruction and hematuria.   SURGEON:  Dr. Bjorn Pippin.   ANESTHESIA:  Spinal.   SPECIMENS:  Prostate chips.   DRAIN:  22-French three-way Foley catheter.   COMPLICATIONS:  None.   INDICATIONS:  [name] is an 77 year old white male with a history of  BPH with prior TURP who has recently had increased voiding symptoms and  hematuria.  Thorough workup demonstrated some regrowth of prostatic  tissue with neovascularity and obstruction.   It was felt that TURP was indicated.   FINDINGS AND PROCEDURE:  The patient is given Cipro, was taken operating  room where spinal anesthetic was induced.  He was placed in lithotomy  position.  His perineum and genitalia were prepped Betadine solution.  He was draped in usual sterile fashion.  He urethra was calibrated to 35-  [place] with male sounds and 28-French continuous flow resectoscope  sheath was inserted without difficulty.  This was fitted with Wandra Scot  handle and prostate loop and 12 degree lens.  Inspection of the bladder  revealed no bladder lesions.  The ureteral orifices were unremarkable.  The prostatic urethra had evidence of prior resection with a large BPH  nodule regrowing from the right posterior lateral wall of the prostate  with associated neovascularity.   The prostatic regrowth was resected down to the capsular fibers.  Additional resection of the bladder  neck and floor was also performed to  ensure adequate patency.   At this point the bladder was evacuated free of chips.  Hemostasis was  assured.  Final inspection revealed no retained chips.  No active  bleeding and an intact external sphincter and ureteral orifice.  A 22-  French three-way Foley catheter was inserted with the aid of a catheter  guide after removal resectoscope.  The balloon was filled with  30 mL sterile fluid.  The catheter was irrigated until clear and placed  to continuous irrigation and straight drainage.  The specimen was sent  for permanent section.  The patient was taken down from lithotomy  position, removed to recovery room in stable condition.  There no  complications.      Excell Seltzer. Annabell Howells, M.D.  Electronically Signed     JJW/MEDQ  D:  06/06/2006  T:  06/06/2006  Job:  [id number]   cc:   Antony Madura, M.D.  Fax: [phone]

## 2010-06-18 NOTE — Assessment & Plan Note (Signed)
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  The last time I saw him on May 31, 2004, no longer receiving PT or  OT because of improvement in mobility.  He continued to receive speech  therapy, but this was discontinued last week because of lack of progress.   PAST MEDICAL HISTORY:  Significant for DJD.   PHYSICAL EXAMINATION:  GENERAL:  No acute distress.  Mood and affect  appropriate.  VITAL SIGNS:  Blood pressure 153/68, pulse 73, respiratory rate 16, O2  saturation 95% on room air.   He still has problems naming, calls a pen a thermos and watch spinach.  He  is able to repeat, however.   Finger-nose-finger testing in normal.  Strength is 5-/5 bilateral deltoids,  biceps, triceps, grips, hip flexion, knee extension, and dorsiflexion.   Gait is wide based but no evidence of toe drag or instability or evidence of  gait imbalance.   IMPRESSION:  Herpes encephalitis.  His main residual deficit is his aphasia  as well as cognitive deficits.   PLAN:  1.  He will follow up with neurology.  2.  Follow up with primary care.  3.  No further formal therapy needed.  I did go over that he may have some      spontaneous improvement in his speech and cognition over the next six      months.       AEK/MedQ  D:  07/12/2004 16:27:57  T:  07/12/2004 23:00:33  Job #:  [id number]   cc:   Santina Evans A. Orlin Hilding, M.D.  [address]  [address]  [place]  [place] [address]  Fax: [phone]   Antony Madura, M.D.  [address]  [place]  [place] [address]  Fax: [phone]

## 2010-06-18 NOTE — H&P (Signed)
NAME:  ROSHAUN, POUND NO.:  [id number]   MEDICAL RECORD NO.:  [id number]          PATIENT TYPE:  IPS   LOCATION:  4034                         FACILITY:  [facility]   PHYSICIAN:  Ranelle Oyster, M.D.DATE OF BIRTH:  1922/12/01   DATE OF ADMISSION:  03/24/2004  DATE OF DISCHARGE:                                HISTORY & PHYSICAL   CHIEF COMPLAINT:  Weakness, confusion, speech difficulties.   HISTORY OF PRESENT ILLNESS:  This is an 77 year old white male with history  of DJD, who was admitted with malaise, difficulty speaking on February 26, 2004.  The patient had a seizure in the emergency department, ultimately  requiring intubation and respiratory support.  MRI of the brain revealed  left temporal lobe increased signals with cortical edema and probable  encephalitis.  The patient's workup revealed his HSV PCR positive.  He was  started on acyclovir 14-day course.  The patient had continued fevers and  workup was positive for one of two blood cultures with Pseudomonas.  The  patient was on [place] through February 13.  The patient was eventually  trached on February 8, then extubated on February 11 and decannulated on  February 21.  The patient has had some ongoing problems with weakness as  well as confusion and generalized speech deficits.   REVIEW OF SYSTEMS:  Positive for diarrhea, decreased hearing, insomnia.   PAST HISTORY:  1.  Right total hip replacement.  2.  Prostate surgery.  3.  Hernia.   FAMILY HISTORY:  Noncontributory.   SOCIAL HISTORY:  The patient is married.  Lives in a one-level home with  supportive wife.  The patient was working as a Medical illustrator prior to admission.   FUNCTIONAL LEVEL:  The patient was independent prior to admission.  Apparently now he is supervision to minimum assist with mobility and total  assistance with ADLs and speech.   MEDICATIONS PRIOR TO ADMISSION:  Multivitamin daily.   ALLERGIES:  PROMOTE TUBE FEEDS,  VANCOMYCIN.   PHYSICAL EXAMINATION:  VITAL SIGNS:  The patient was afebrile, vital signs  are stable.  GENERAL:  General appearance is normal.  HEENT:  Pupils equal, round, and reactive to light.  Sclerae were slightly  red.  Oral mucosa was pink and moist.  NECK:  Trach stoma is closing nicely with minimal drainage.  Neck was supple  otherwise.  CHEST:  Clear to auscultation except for some mild crackles at the bases.  CARDIAC:  Regular rate and rhythm.  ABDOMEN:  Soft, nontender, bowel sounds positive.  MUSCULOSKELETAL:  The patient had good range of motion of both limbs.  Strength was 4+/5 in the right upper extremity, 4 to 4+/5 in the right lower  extremity.  Left side was 5/5.  Sensory exam may have been a bit diminished  on the right.  The patient had decreased fine motor coordination of the  right upper and lower extremities today.  NEUROLOGIC:  On further neurologic examination, cranial nerves II-XII were  intact.  Reflexes were normal.  The patient had impaired judgment.  Orientation was fair with cues.  Memory was poor.  Mood was generally  appropriate.  The patient had significant word-finding deficits and also  receptive language deficits as well.  He demonstrated word salad quite  frequently.  He had problems with simple one-step commands on many  occasions.   ASSESSMENT AND PLAN:  1.  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  Begin      comprehensive inpatient rehab.  Estimated length of stay is 10-14 days.      Prognosis is good.  Goals are supervision.  2.  Deep vein thrombosis prophylaxis with ambulation.  3.  Dysphagia:  Continue D1 honey-thick liquids.  Monitor fluid status.      Check BMET tomorrow.  4.  Seizure prophylaxis.  Continue Depakote for six months total.  5.  Anemia.  Add an iron supplement.  6.  Sleep.  Will monitor sleep-wake cycle.  Use p.r.n.  trazodone.      ZTS/MEDQ  D:  03/24/2004  T:  03/24/2004  Job:  [id number]

## 2010-06-18 NOTE — Discharge Summary (Signed)
NAME:  Ronald Roberts, GROFT NO.:  [id number]   MEDICAL RECORD NO.:  [id number]          PATIENT TYPE:  INP   LOCATION:  3003                         FACILITY:  [facility]   PHYSICIAN:  Charlaine Dalton. Sherene Sires, M.D. Romualdo Bolk OF BIRTH:  [birth year]   DATE OF ADMISSION:  02/26/2004  DATE OF DISCHARGE:                                 DISCHARGE SUMMARY   DATE OF ANTICIPATED DISCHARGE:  03/24/2004   DISPOSITION:  To [facility].   DISCHARGE DIAGNOSES:  1.  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  2.  Left lower lobe hospital-acquired pneumonia with positive pseudomonas      blood cultures.  3.  New onset seizure.  4.  Drug rash.  5.  Dysphagia.  6.  Debilitation.   PROCEDURES:  1.  Endotracheal tube placed 01/26, discontinued on 02/08.  2.  Tracheostomy tube placed by Dr. Pollyann Kennedy on 02/08, discontinued on      03/23/2004.  3.  Left radial A-line on 02/29/2004, discontinued.  4.  Central venous line placed 02/29/2004, discontinued 03/07/2004.  5.  Left PICC line placed 03/07/2004, discontinued 03/22/2004.   TESTS:  1.  EEG reviewed on 03/12/2004 negative for seizure activity.  2.  Compared with EEG on 02/27/2004 demonstrating a low-voltage amplitude      and a left frontal temporal slowing was negative for epileptiform      activity.  3.  CT of the head on 01/26 demonstrating no acute intracranial findings.      Mild symmetrical atrophy.  4.  MRI of the brain on 02/27/2004 demonstrating abnormal signal left      temporal lobe with cortical edema and mild enhancement.  5.  Differential diagnoses including encephalitis including herpes      encephalitis, though also to be postictal changes.  Barium swallow      obtained on 03/22/2004 demonstrating moderate oral and pharyngeal delay      of all consistencies. __________ penetration of nectar-thick barium was      demonstrated without aspiration.  6.  Last portable chest x-ray received on  03/15/2004 demonstrating mild      subsegmental atelectasis.  7.  Followup MRI of the brain on 03/08/2004, again, favoring herpes      encephalitis demonstrating progression in the extent of edema in the      left hippocampus, temporal lobe, and insular cortex and progression in      the amount of cortical enhancement seen in this area.  It was noted to      be a positive on diffusion weighted imaging.  There was no gross      hemorrhage.   BRIEF HISTORY:  This is a 77 year old white male with no significant past  medical history who presented to his primary care [name] on 02/24/2004  with increasing confusion and incomprehensible speech. He had been scheduled  for MRI of his head on 02/26/2004 by his primary care [name] with a  diagnosis of a TIA.  The morning of admission prior to the patient's  presentation the wife noticed the patient's speech becoming progressively  worse.  He also became confused and agitated.  The patient's primary care  [name] was contacted and he was brought to the emergency room.  On arrival  in the emergency room the patient had expressive aphasia with a witnessed  focal, then grand mal seizure. He was taken to CT scan.  While the patient  was in CT scanning he had increased work of breathing and respirations in  the 40s-50s and was electively intubated for airway protection.   PROBLEM LIST BY DISCHARGE DIAGNOSIS:  Problem #1:  VENTILATORY DEPENDENT  RESPIRATORY FAILURE SECONDARY TO SEIZURE AND MAINTENANCE OF AIRWAY.  The  patient, as previous noted was intubated on 02/26/2004 for the above stated  reason. He was maintained on mechanical ventilation throughout his acute  phase of illness.  He was begun on the weaning protocol on 03/11/2004 and  was liberated from mechanical ventilation after placement of a tracheostomy  on 03/11/2004.  His respiratory status continued to improve with the  decannulation of the trache performed on 03/23/2004.  The patient  is now  breathing without dyspnea on room air with breath sounds clear and a chest x-  ray demonstrating mild atelectasis.   Problem #2:  LEFT LOWER LOBE PNEUMONIA.  Thought to be vent dependent  diagnosed on 03/06/2004; also with positive 2 out of 4 blood cultures  positive for pseudomonas.  Infectious disease was following for this. The  patient was treated with a full course of Fortaz successfully.  The Elita Quick  has since been discontinued on 03/15/2004.   Problem #3:  HERPES ENCEPHALITIS.  Neurology was consulted after witness of  seizure and patient was placed on anticonvulsant therapy as well as having a  CT of the head obtained.  CT of head and MRI suggesting possible herpes  encephalitis.  The patient was treated successfully with a 14-day course of  acyclovir for this.  Followup EEG was negative for epileptic activity.  The  patient is currently on Depakote for antiepileptic therapy with neurology  recommending followup Depakote levels on the week of the 27th.  The patient  suffered acute delirium from the herpes encephalitis; however, following his  successful treatment he has had a rapid improvement in his neurological  exam.  He continues to have expressive aphasia; however, has made dramatic  improvement in his loss of consciousness and functional ability.  Given the  rate of his rapid improvement he is felt to be a strong rehab candidate.   Problem #4:  DYSPHAGIA.  The patient has undergone 2 swallowing evaluations,  the last one being on 03/22/2004 demonstrating moderate oropharyngeal  dysphagia with laryngeal penetration to the vocal cords with nectar-thick  liquids.  The patient did not have penetration to the lung.  Given these  findings, it was felt that the patient could safely tolerate a nectar thick  diet with aspiration precautions strictly followed.   Problem #5:  DEBILITATION.  The patient has been undergoing physical therapy and rehab; however, due to his  prolonged medical convalescence he will  require extensive and aggressive inpatient rehab.   Problem #6:  RASH, DRUG DEPENDENT.  The patient did develop acute rash and  fever during the acute course of his illness.  Infectious disease was  following it, at this time. It was felt by our infectious disease specialist  that this was drug-induced and the vancomycin and Dilantin were both  discontinued and the rash quickly went away.  During this time it was  assumed that this was  a vancomycin-induced rash; however, Dilantin may also  be the potential cause.   MEDICATION LIST:  1.  The patient will go on albuterol 2.5 mg inhaled q.6h.  2.  Depakote 500 mg p.o. q.6h.  3.  Pepcid 20 mg p.o. q.12h.  4.  Sliding scale insulin, NovoLog.  5.  Tylenol 650 mg p.o. q.4h. p.r.n.  6.  Thicket, instant food thickener, to obtain nectar-thick meals.   DIET:  To be nectar thick with full dysphagia/aspiration precautions.   ACTIVITY:  As tolerated per physical medication and rehab.   FOLLOWUP:  The patient will be discharged to acute physical rehab at [facility].  Call for any pulmonary issues.   DISPOSITION:  Upon discharge the patient's vital signs are stable.  He is  afebrile.  His heart rate is 74, respiratory rate is 20, blood pressure  158/78, blood glucose stable at 104-115.  He is alert and oriented to person  and place.  He clearly exhibits some expressive and some mild receptive  aphasia.  Requiring verbal cueing to follow commands.  He does move all  extremities with no clear focal deficits.  Respirations are clear.  Heart  tones are normal.  He has no edema.  He is tolerating his dysphagia diet.  His last white blood cell count is 6.9, hemoglobin 11.8, platelets 327.  Sodium 135, potassium 4, chloride 103, CO2 27, glucose 96, BUN 23,  creatinine 1.0.  Valproic acid 53.1.  This was obtained on 03/18/2004.  The  CBC and blood chemistry, just mentioned, were on 03/22/2004.      PB/MEDQ   D:  03/23/2004  T:  03/23/2004  Job:  [id number]   cc:   Santina Evans A. Orlin Hilding, M.D.  [address]  [address]  [place]  [place] [address]  Fax: [phone]   Cliffton Asters, M.D.  [address] [place]  [place] [address]  Fax: [phone]   Jeannett Senior. Pollyann Kennedy, MD  [id number] [place]  [place] [address]  Fax: [phone]   Ellwood Dense, M.D.  [address] [place]  [place] [address]  Fax: [phone]   Charlaine Dalton. Sherene Sires, M.D. [facility]

## 2010-06-18 NOTE — Discharge Summary (Signed)
NAME:  Ronald Roberts, Ronald Roberts NO.:  [id number]   MEDICAL RECORD NO.:  [id number]          PATIENT TYPE:  IPS   LOCATION:  4034                         FACILITY:  [facility]   PHYSICIAN:  Erick Colace, M.D.DATE OF BIRTH:  1922/03/27   DATE OF ADMISSION:  03/24/2004  DATE OF DISCHARGE:  04/02/2004                                 DISCHARGE SUMMARY   DISCHARGE DIAGNOSES:  1.  Herpes encephalopathy.  2.  Dysphagia.  3.  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  4.  Urinary retention, resolved.   HISTORY OF PRESENT ILLNESS:  Ronald Roberts is an 77 year old male with a history  of DJD, otherwise in good health, admitted on February 26, 2004, with a three  to four-day history of malaise, speech problems, question of TIA event.  He  had been set up for MRI by LMD on an outpatient basis, however, he was  brought to the ED, as the patient had worsening symptoms.  The patient was  noted to have tonic-clonic seizure while in triage requiring intubation.  CT  of head was done and negative.  Dr. Orlin Hilding was consulted for input on the  patient and recommended LP and EEG, that were done.  MRI of the brain done  showed abnormal signal in the left temporal lobe with cortical edema and  enhancement with question of herpes encephalitis.  The patient was started  on Acyclovir as well as antibiotics and loaded with Dilantin for seizure  prevention.  ID was consulted for input and recommended Acyclovir x14 days  if CSF, HSV, and PC are positive.  The patient continued with fevers  requiring multiple antibiotics.  Most recent Pseudomonas bacteremia was  treated with Elita Quick.  The patient required a trach placement on March 10, 2004, by Dr. Pollyann Kennedy.  He was extubated without difficulty on March 13, 2004, and the cannula on March 23, 2004.  The patient's encephalopathy  was slowly improving with clearing of mentation.  He has had some problems  with expressive aphasia.  He was also noted to  have dysphagia with  penetration with nectar-thick liquids, currently on pureed honey-thick  liquids.  The patient was changed over to Depakote for seizure prophylaxis.  Therapies were initiated, and the patient had close supervision for bed  mobility, min to contact guard for transfers, min to contact guard assist  for ambulating greater than 250 feet, moderate to total assist for ADLs.   PAST MEDICAL HISTORY:  Significant for right total hip replacement, prostate  surgery, hernial repair.   ALLERGIES:  ProMod tube feeds, vancomycin.   FAMILY HISTORY:  Noncontributory.   SOCIAL HISTORY:  The patient is married.  He is lives in a one level home  with one to two steps at entry.  He was independent and working prior to  admission.  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  He does not use  any tobacco or alcohol.   HOSPITAL COURSE:  Ronald Roberts was admitted to rehab on March 24, 2004, for inpatient therapies to consist of PT/OT daily.  The  patient was  maintained on Depakote for seizure prophylaxis.  Dr. Orlin Hilding, who has  followed up on the patient, recommended continuing Depakote for six months.  She will reevaluate the patient at that time.  Secondary to the patient's  dysphagia, labs were checked at past admission, and this revealed a  hemoglobin of 12.6, hematocrit 36.4, white count 6.4, platelets 256,000.  Sodium 137, potassium 3.7, chloride 105, CO2 26, BUN 21, creatinine 1.1,  glucose 101.  UA was checked, and the patient's urine was noted to be  positive.  He was started on amoxicillin initially, however, as the  patient's urine grew out greater than 100,000 colonies of Pseudomonas, he  was changed over to Cipro and continued on this for seven total days of  antibiotic therapy.  The patient's diet was noted to be poor and D1.  A  repeat MBS was done on March 26, 2004, and D3 trials were initiated.  As  the patient was able to tolerate this, he was initially  advanced to D3 honey-  thick liquids.  He has been able to tolerate this without difficulty.  The  patient's fluid intake has been poor secondary to his dislike of honey-thick  liquids.  He was noted to have some problems with concentrated urine and was  treated with IV fluids to help with hydration.  This was discontinued on  March 31, 2004.  The patient was started on water protocol, and the family  has been instructed regarding water protocol to help with hydration status.  Repeat check of fluid status prior to discharge showed a BUN 23, creatinine  1.2.  The patient is to continue pushing fluids post discharge.  Repeat BMP  on April 08, 2004, with results to Dr. Su Hilt.   During his stay at rehab, Ronald Roberts balance improved.  He continues with  decreased memory and poor safety awareness.  He is currently at supervision  level for bed mobility, supervision level for transfers, min guard assist  without assistive device in an unsupervised environment.  He is able to  navigate 250 feet indoors with close supervision with min cuing.  He  requires supervision to navigate stairs.  He is currently at set up assist  for upper body care, supervision with min cuing for low body care.  Speech  therapy has been following him along, and a repeat swallow was done prior to  discharge, however, the patient was noted to continue with __________  dysphagia with aspiration of thin and penetration of nectar; he remains on  honey-thick liquids currently.  The patient continues with fluent aphasia.  Word-finding:  He is noted to have neologisms, which are used as  compensatory strategy secondary to his word-finding difficulty.  He does  tend to get easily distracted requiring redirection to tasks.  The patient  will require 24-hour supervision secondary to safety risks secondary to  aphagia.  Advised to provide 24-hour supervision post discharge.   DISCHARGE MEDICATIONS: 1.  Depakote sprinkles 125 mg p.o.  q.i.d.  2.  Trinsicon one p.o. b.i.d.  3.  Multivitamin one per day.  4.  Cipro 250 mg p.o. b.i.d.   DIET:  Soft honey-thick liquids.   ACTIVITY:  With supervision.   FOLLOWUP:  The patient is to follow up with Dr. Orlin Hilding for an appointment  in four weeks.  Follow up with Dr. Wynn Banker on 05/11/04.  Follow up with Dr.  Burton Apley in the next couple of weeks.  Home health services with  PT/OT/speech therapy.   SPECIAL INSTRUCTIONS:  No alcohol, no smoking, no driving.  Open Depakote  sprinkle tab and administer in applesauce or pudding.  Continue pushing  fluids to maintain hydration status.  Follow up with me on 04/26/04.      PP/MEDQ  D:  04/02/2004  T:  04/03/2004  Job:  [id number]   cc:   Antony Madura, M.D.  [address]  [place]  [place] [address]  Fax: [phone]   Gustavus Messing. Orlin Hilding, M.D.  [address]  [address]  [place]  [place] [address]  Fax: [phone]

## 2010-06-18 NOTE — Discharge Summary (Signed)
NAME:  Ronald Roberts, Ronald Roberts NO.:  [id number]   MEDICAL RECORD NO.:  [id number]          PATIENT TYPE:  INP   LOCATION:  4741                         FACILITY:  [facility]   PHYSICIAN:  Pramod P. Pearlean Brownie, MD    DATE OF BIRTH:  1922/03/22   DATE OF ADMISSION:  09/01/2006  DATE OF DISCHARGE:  09/04/2006                               DISCHARGE SUMMARY   ADMISSION DIAGNOSIS:  Seizure.   DISCHARGE DIAGNOSES:  1. Dilantin toxicity.  2. New onset generalized seizure.  3. Herpes simplex and cephalitis with significant aphasia.   HOSPITAL COURSE:  Ronald Roberts is an 77 year old pleasant, Caucasian male  who was brought to the [facility] emergency room by his wife following  an episode of nausea and seizure at home.  The patient was loaded with  IV fosphenytoin for treatment of seizures.  The initial plan was to  discharge the patient home; however, when he tried to get up he was  found to be significantly ataxic and a little nauseous following the  dilantin load.  He was subsequently admitted to [facility]  directly to the floor.  His dilantin level was found to be elevated, and  so dilantin was initially held.  Subsequently, he was started on low  dose of 200 mg a day.  He was seen by physical therapy for gait  imbalance, was found to be stable to be discharged to home in the care  of his wife and daughter.  He remained subsequently aphasic on the day  of admission, but this was old from his dysphasia from encephalitis from  May 2006.  He was advised to follow up with his neurology, Dr. Orlin Hilding,  follow up in their office in a month, and with his cardiologist, Dr.  Allyson Sabal, as needed.  Arrangements were made for home physical therapy.  The patient was seen in consultation during hospitalization by  outpatient cardiology, and was felt to be stable for discharge.   DISCHARGE MEDICATIONS:  1. Dilantin 100 mg 2 tablets daily.  2. Multivitamin once a day.  3. Glucosamine tablet  twice a day.   Plans were made for home PT/OT.           ______________________________  Sunny Schlein. Pearlean Brownie, MD     PPS/MEDQ  D:  09/28/2006  T:  09/28/2006  Job:  [id number]   cc:   Santina Evans A. Orlin Hilding, M.D.  Nanetta Batty, M.D.

## 2010-06-18 NOTE — Procedures (Signed)
TECHNICAL DESCRIPTION:  This EEG was recorded in an unresponsive intubated  patient.  The background activity shows low voltage fast beta activity with  some focal slowing in the left temporal region.  Intermittent sharp waves  are noted on the left but no definite epileptiform activity is seen and  those phase reversals are present.  Photic stimulation and hyperventilation  testing were not performed.   IMPRESSION:  This is an abnormal EEG showing evidence of low voltage  amplitude and left frontotemporal slowing.  No epileptiform activity is  noted on this study.      ZOX:WRUE  D:  02/27/2004 16:09:17  T:  02/28/2004 10:21:02  Job #:  [id number]

## 2010-06-18 NOTE — Discharge Summary (Signed)
NAME:  Ronald Roberts, Ronald Roberts NO.:  [id number]   MEDICAL RECORD NO.:  [id number]                   PATIENT TYPE:  INP   LOCATION:  0453                                 FACILITY:  [facility]   PHYSICIAN:  Almedia Balls. Ranell Patrick, M.D.              DATE OF BIRTH:  02/26/1922   DATE OF ADMISSION:  03/03/2003  DATE OF DISCHARGE:  03/06/2003                                 DISCHARGE SUMMARY   j   ADMISSION DIAGNOSIS:  Right hip osteoarthritis.   DISCHARGE DIAGNOSES:  1. Right hip osteoarthritis with right total hip arthroplasty.  2. Postoperative hyponatremia, improved.   PROCEDURE:  Ronald Roberts was taken to the operating room at [facility] on  March 03, 2003, where a right total hip arthroplasty using a DePuy S-ROM  prosthesis was used. Surgeon was Dr. Malon Kindle. First assistant was Dr.  _____________, and second assistant Alphonsa Overall, PA-C.  Anesthesia was spinal  with MAC. Blood loss was 500 cc. Fluids replaced was 2500 crystalloid. Urine  output was 550 ml.  Perioperative antibiotics were given per protocol. There  were no complications with the surgery.   BRIEF HISTORY:  Ronald Roberts is a 77 year old male who presented with end-stage  osteoarthritis to his right hip. The patient complained of activity related  pain including rest pain and night pain. The patient's mobility had been  restricted secondary to his arthrosis as documented on plain x-rays.  The  patient failed conservative management consisting of activity modification,  anti-inflammatory medications. However, over the course of several years,  the patient now presents desiring a total hip replacement.   LABORATORY DATA:  Upon admission CBC from Ronald Roberts showed a hemoglobin of  12.9, hematocrit 36.9. First day postoperative, H&H was 11.9/34.6  respectively and on day of discharge the H&H was 11.9 and 34.7. Chemistries  on admission, none were obtained. The first day postoperatively the patient  was noted  to have a sodium of 132, and glucose of 252. Upon discharge, the  hyponatremia had resolved and sodium was 138 and glucose was 121.  All other  chemistries were normal throughout the entire course postoperatively and  through the day of discharge. PT-INR preoperatively was 14.7 and 1.2  respectively. The first day postoperative, his PT was 18.0, INR 1.8. On the  day of discharge, PT was 18.8 and INR was 1.9. No specimens were obtained  during surgery and no gram stain cultures taken.   HOSPITAL COURSE:  The patient was admitted to [facility], taken to  the OR, and underwent the above-stated procedure without any complications.  The patient tolerated the procedure extremely well and was later transferred  to the post anesthesia care unit where he spent an adequate amount of time  for recovery and was transferred up to room 453 at [facility]. The patient  was put on a Dilaudid PCA pump which the patient states  he used quite  infrequently throughout his stay. On postoperative day one, the patient  states he was doing quite well. He states he had a little bit of nausea over  the night, especially after he used a little bit of Dilaudid, but the nausea  resolved with some Phenergan p.o. The patient was able to take p.o.  medications by the end of postoperative day one without difficulty, really  not complaining about any pain at all.  On postoperative day two, the  patient states he was doing extremely well, desires to have Foley removed,  and also the IVs taken down, which we have completed. The patient was able  to take p.o. fluids very well and p.o. medications. The patient was also out  of bed to chair and to bathroom with assistance with walker and knee  immobilizer without difficulty. On postoperative day three, rehab  consultation states that patient would probably do quite well with home  therapy versus rehab stay. Therefore, they were recommending home physical  therapy. The  patient was also released from meeting all goals regarding  occupational therapy and physical therapy by postoperative day three.  Therefore, discussion with the patient, the patient desired to be discharged  and discussion with family agreed that the patient was ready for discharge.  Home health was set up and the patient was discharged on postoperative day  three which was on March 06, 2003.   DISCHARGE PLANNING:  The patient was discharged home on March 06, 2003.   DISCHARGE MEDICATIONS:  Coumadin, Percocet, and Robaxin.   DIET:  Regular diet.   FOLLOWUP:  The patient is to call [phone] and to call the office for an  appointment in seven to ten days.   ACTIVITY:  Toe-touch weightbearing as tolerated and PT and home health  nursing to monitor PT and INR.   DISPOSITION:  Home.   CONDITION ON DISCHARGE:  Stable and improving.     Thomas B. Durwin Nora, P.A.                     Almedia Balls. Ranell Patrick, M.D.    TBD/MEDQ  D:  03/06/2003  T:  03/06/2003  Job:  [id number]

## 2010-06-18 NOTE — Procedures (Signed)
CLINICAL HISTORY:  An 77 year old male with fever and confusion.   Medication listed Seroquel, Klonopin, Protonix, Tylenol, Lasix, Dilantin,  Zosyn, albumin, Ativan.   Background awake rhythm consists of 8-9 Hz  of diminished amplitude, it is  admixed with high frequency low amplitude beta activity seen intermittently  throughout the tracing which may be medication effect.  Intermittent 6-7 Hz  theta slowing is seen throughout the recording.  There is intermittent left  central alpha activity seen throughout the recording.  No paroxysmal  epileptiform activity, spikes or sharp waves are seen.  Length of the  tracing is 21.5 minutes.  Technical component is suboptimal with excessive  artifact seen throughout the tracing.  Activation procedures were not  performed.  Sleep stages I and II are seen.  EKG tracing reveals regular  sinus rhythm.   IMPRESSION:  This EEG performed during sleep states is abnormal due to  presence of mild bihemispheric dysfunction as well as focal left hemispheric  cortical irritability.  No definite epileptiform features were however,  seen.      ZOX:WRUE  D:  03/08/2004 19:09:31  T:  03/08/2004 20:24:19  Job #:  [id number]

## 2010-11-15 LAB — POCT CARDIAC MARKERS
Myoglobin, poc: >500
Operator id: 1627
Operator id: 1627
Troponin i, poc: <0.05

## 2010-11-15 LAB — URINALYSIS, ROUTINE W REFLEX MICROSCOPIC
Bilirubin Urine: NEGATIVE
Glucose, UA: NEGATIVE
Ketones, ur: NEGATIVE
Leukocytes, UA: NEGATIVE
pH: 6.5

## 2010-11-15 LAB — COMPREHENSIVE METABOLIC PANEL
CO2: 24
Calcium: 8.6
Creatinine, Ser: 1.21
GFR calc Af Amer: >60
GFR calc non Af Amer: 57 — ABNORMAL LOW
Glucose, Bld: 102 — ABNORMAL HIGH

## 2010-11-15 LAB — URINE MICROSCOPIC-ADD ON

## 2010-11-15 LAB — CBC
HCT: 41.9
Hemoglobin: 14.2
Hemoglobin: 14.5
MCHC: 33.8
MCHC: 34.2
MCV: 90.8
MCV: 91.6
Platelets: 185
RBC: 4.67
RDW: 12.9

## 2010-11-15 LAB — BASIC METABOLIC PANEL
BUN: 13
BUN: 17
CO2: 25
Chloride: 109
Creatinine, Ser: 1.32
GFR calc non Af Amer: >60
Glucose, Bld: 101 — ABNORMAL HIGH
Glucose, Bld: 160 — ABNORMAL HIGH
Potassium: 3.4 — ABNORMAL LOW

## 2010-11-15 LAB — DIFFERENTIAL
Lymphocytes Relative: 9 — ABNORMAL LOW
Lymphs Abs: 0.6 — ABNORMAL LOW
Neutrophils Relative %: 84 — ABNORMAL HIGH

## 2010-11-15 LAB — PHENYTOIN LEVEL, FREE AND TOTAL: Phenytoin Bound: 19.8

## 2010-11-15 LAB — CULTURE, BLOOD (ROUTINE X 2)

## 2010-11-15 LAB — MAGNESIUM: Magnesium: 2.4

## 2010-12-07 ENCOUNTER — Ambulatory Visit (INDEPENDENT_AMBULATORY_CARE_PROVIDER_SITE_OTHER): Payer: Medicare Other | Admitting: General Surgery

## 2010-12-07 ENCOUNTER — Encounter (INDEPENDENT_AMBULATORY_CARE_PROVIDER_SITE_OTHER): Payer: Self-pay | Admitting: General Surgery

## 2010-12-07 VITALS — BP 138/88 | HR 60 | Temp 96.2°F | Resp 16 | Ht 64.0 in | Wt 180.2 lb

## 2010-12-07 DIAGNOSIS — K4090 Unilateral inguinal hernia, without obstruction or gangrene, not specified as recurrent: Secondary | ICD-10-CM

## 2010-12-07 DIAGNOSIS — K4030 Unilateral inguinal hernia, with obstruction, without gangrene, not specified as recurrent: Secondary | ICD-10-CM

## 2010-12-07 NOTE — Progress Notes (Signed)
Subjective:     Patient ID: Ronald Roberts, male   DOB: 25-May-1922, 77 y.o.   MRN: [id number]  HPIMr. 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 The patient the patient says he has nocturia 2-3 times at night patient's wife says is usually twice and that he is followed by Ronald Roberts and Dr. rim and also noted that the hernia was quite large the patient's last colonoscopy was greater than 5 years ago and he has had a polyp removed 2 years ago by Ronald Roberts and requested that he have her followup barium white last week 3 years ago but the patient declined. 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 but he tries to minimize any physical weakness   Review of SystemsNo Known Allergies Current Outpatient Prescriptions  Medication Sig Dispense Refill  . DILANTIN 100 MG ER capsule       . levothyroxine (SYNTHROID, LEVOTHROID) 50 MCG tablet        Past Surgical History  Procedure Date  . Prostate surgery [phone] and 2006  . Joint replacement 1995    right hip replacement   The patient denies any chest pain high blood pressure or cardiac issues and he doesn't have any episodes of bloating cramping abdominal pain like he's had partial colon obstruction  the patient's wife says his bowels are usually quite regular but on examination today he got a large amount of fairly firm stool in his colon expected he is not to evacuate the colon H. time with his hernia is large.      Objective:   Physical Exam BP 138/88  Pulse 60  Temp(Src) 96.2 F (35.7 C) (Temporal)  Resp 16  Ht 5\' 4"  (1.626 m)  Wt 180 lb 4 oz (81.761 kg)  BMI 30.94 kg/m2 Patient is an elderly male appears his stated age and is oriented to time place and person he talks with count of some difficulty in the is difficult to get a direct answer but I did not come around the back answer. His lungs good breath sounds bilaterally her cardiac normal sinus rhythm abdomen soft there is no actual masses like constipation and then used a football-sized hernia in the left groin I'm not able to manually reduce I don't appreciate a weakness at the umbilicus the right inguinal area and rectal examination was a moderate amount of firm stool in the rectum that is Hemoccult negative I don't appreciate any neurological problems and he is not got swollen lower extremities. His CNS appears to be more amenable speech impairment and actually a standard all extremities and functions.     Assessment:     Patient has a very large  This probably check colon within it and I could not reduce the herniI would recommend that we get a CAT n of the abdomen and pelvis andI'll also follow Dr. Raenette Roberts and as he appears that he certainly having nocturia 2-3 times a night and hopefully the patient will hav problems of urinary retention afterwards. The patient is not on any type of tions for his prostate and difficulty voiding    Plan:     He CT of the abdomen pelvis and schedule a talk with Ronald Roberts and we will see the patient back after his x-rays the flexor schedule him for surgery his recent him blood work is good white count of 1009 hemoglobin of 43 serum electrolytes are normal BUN of 22 and a creatinine of 1.1 we will  be doing his surgery as an inpatient at [facility] with general anesthesia her and I would recommend that the patient start taking Ronald Roberts hopefully when I see him the next visit hoping to reduce the hernia as I do not appreciate evidence of obstructive type: Symptoms from the patient or his wife

## 2010-12-10 ENCOUNTER — Ambulatory Visit
Admission: RE | Admit: 2010-12-10 | Discharge: 2010-12-10 | Disposition: A | Discharge status: Home / Self Care | Payer: Medicare Other | Source: Ambulatory Visit | Attending: General Surgery | Admitting: General Surgery

## 2010-12-10 DIAGNOSIS — K4090 Unilateral inguinal hernia, without obstruction or gangrene, not specified as recurrent: Secondary | ICD-10-CM

## 2010-12-10 MED ORDER — IOHEXOL 300 MG/ML  SOLN
100.0000 mL | Freq: Once | INTRAMUSCULAR | Status: AC | PRN
  Administered 2010-12-10: 100 mL via INTRAVENOUS

## 2010-12-15 ENCOUNTER — Ambulatory Visit (INDEPENDENT_AMBULATORY_CARE_PROVIDER_SITE_OTHER): Payer: Medicare Other | Admitting: General Surgery

## 2010-12-15 ENCOUNTER — Encounter (INDEPENDENT_AMBULATORY_CARE_PROVIDER_SITE_OTHER): Payer: Self-pay | Admitting: General Surgery

## 2010-12-15 VITALS — BP 134/68 | HR 72 | Temp 96.3°F | Resp 20 | Ht 63.0 in | Wt 178.2 lb

## 2010-12-15 DIAGNOSIS — K4090 Unilateral inguinal hernia, without obstruction or gangrene, not specified as recurrent: Secondary | ICD-10-CM

## 2010-12-15 NOTE — Progress Notes (Signed)
Addended by: Iona Coach on: 12/15/2010 02:33 PM   Modules accepted: Orders

## 2010-12-15 NOTE — H&P (Signed)
Patient ID: Ronald Roberts, male   DOB: 04/09/1922, 77 y.o.   MRN: [id number]  HPI [name] returns is 77-year-old patient of Dr. Ron Robert is at the very large left inguinal hernia was not able to reduce last week for saw the patient and I did get a CT of the abdomen and pelvis there's no bladder and hernia but there was a very large gland and the patient was extremely constipated and I started him on MiraLax which she's been taken down for about 5 days was able to reduce the hernia today course it comes back I very quickly after he stands and the patient states that he would like to go ahead and proceed on getting a hernia repair but we will wait until after Thanksgiving. The patient will will be at least an overnight stay and possibly tonight planning to do general anesthesia her blood we will laminal placement of Foley catheter since his hernias are large and a brother that her catheter will be removed that day over the next morning and will be decided postoperatively I want the patient to continue with the MiraLax one or 2 times a day until his herniorrhaphy and we will also let him start a liquid diet to 3 days prior to the actual surgical repair so that there is no way that he is constipated. There was no blood in his stool it's been 5 or 6 years since these had a colonoscopy but I think it would be fine to proceed on with a hernia repair without any focal testing of his colon  Review of Systems Past Surgical History Procedure Date . Prostate surgery [id number] and 2006 . Joint replacement 1995   right hip replacement   No Known Allergies Current Outpatient Prescriptions Medication Sig Dispense Refill . DILANTIN 100 MG ER capsule      . levothyroxine (SYNTHROID, LEVOTHROID) 50 MCG tablet           Objective:  Physical ExamBP 134/68  Pulse 72  Temp(Src) 96.3 F (35.7 C) (Temporal)  Resp 20  Ht 5' 3" (1.6 m)  Wt 178 lb 4 oz (80.854 kg)  BMI 31.58 kg/m2 On physical examination today the  patient is a less confused course his bed in the previous and with his AMS issues from his previous meningitis I know what to expect and given the time to anteriorly appropriate questions.. His wife does most of the anserine that the patient is sorry not any type of discomfort today and his abdomen is soft she says he's had several very large bowel movement since taken to MiraLax appear eyes ears nose and throat essentially negative lungs are clear cardiac normal sinus rhythm abdomen tender protuberant abdomen but it is soft bowel sounds are active I was able to reduce the large inguinal hernia this is a football size sac and I did not repeat the rectal examination there is no evidence of any hernia noted on the right and they'll call to schedule her at this time and will probably plan a week after Thanksgiving O. the first the following week. They'll start a liquid diet 2-3 days prior to the hernia repair and we'll also have him keep him one or possibly 2 nights after surgery would his postoperative course goes.     Assessment:    Giant left inguinal hernia reducible today    Plan:    Left inguinal hernia repair week after Thanksgiving 1or 2 nights in hospital post op    

## 2010-12-15 NOTE — Progress Notes (Signed)
Subjective:     Patient ID: Ronald Roberts, male   DOB: March 03, 1922, 77 y.o.   MRN: [id number]  HPI Ronald Roberts returns is 77 year old patient of Dr. Doristine Church is at the very large left inguinal hernia was not able to reduce last week for saw the patient and I did get a CT of the abdomen and pelvis there's no bladder and hernia but there was a very large gland and the patient was extremely constipated and I started him on MiraLax which she's been taken down for about 5 days was able to reduce the hernia today course it comes back I very quickly after he stands and the patient states that he would like to go ahead and proceed on getting a hernia repair but we will wait until after Thanksgiving. The patient will will be at least an overnight stay and possibly tonight planning to do general anesthesia her blood we will laminal placement of Foley catheter since his hernias are large and a brother that her catheter will be removed that day over the next morning and will be decided postoperatively I want the patient to continue with the MiraLax one or 2 times a day until his herniorrhaphy and we will also let him start a liquid diet to 3 days prior to the actual surgical repair so that there is no way that he is constipated. There was no blood in his stool it's been 5 or 6 years since these had a colonoscopy but I think it would be fine to proceed on with a hernia repair without any focal testing of his colon  Review of Systems Past Surgical History  Procedure Date  . Prostate surgery [phone] and 2006  . Joint replacement 1995    right hip replacement    No Known Allergies Current Outpatient Prescriptions  Medication Sig Dispense Refill  . DILANTIN 100 MG ER capsule       . levothyroxine (SYNTHROID, LEVOTHROID) 50 MCG tablet            Objective:   Physical ExamBP 134/68  Pulse 72  Temp(Src) 96.3 F (35.7 C) (Temporal)  Resp 20  Ht 5\' 3"  (1.6 m)  Wt 178 lb 4 oz (80.854 kg)  BMI 31.58 kg/m2 On  physical examination today the patient is a less confused course his bed in the previous and with his AMS issues from his previous meningitis I know what to expect and given the time to anteriorly appropriate questions.Marland Kitchen His wife does most of the anserine that the patient is sorry not any type of discomfort today and his abdomen is soft she says he's had several very large bowel movement since taken to MiraLax appear eyes ears nose and throat essentially negative lungs are clear cardiac normal sinus rhythm abdomen tender protuberant abdomen but it is soft bowel sounds are active I was able to reduce the large inguinal hernia this is a football size sac and I did not repeat the rectal examination there is no evidence of any hernia noted on the right and they'll call to schedule her at this time and will probably plan a week after Thanksgiving O. the first the following week. They'll start a liquid diet 2-3 days prior to the hernia repair and we'll also have him keep him one or possibly 2 nights after surgery would his postoperative course goes.     Assessment:     Giant left inguinal hernia reducible today    Plan:     Left  inguinal hernia repair week after Thanksgiving 1or 2 nights in hospital post op

## 2010-12-17 ENCOUNTER — Encounter (HOSPITAL_COMMUNITY): Payer: Self-pay | Admitting: Pharmacy Technician

## 2010-12-17 ENCOUNTER — Other Ambulatory Visit (INDEPENDENT_AMBULATORY_CARE_PROVIDER_SITE_OTHER): Payer: Self-pay | Admitting: General Surgery

## 2010-12-20 ENCOUNTER — Encounter (HOSPITAL_COMMUNITY)
Admission: RE | Admit: 2010-12-20 | Discharge: 2010-12-20 | Disposition: A | Discharge status: Home / Self Care | Payer: Medicare Other | Source: Ambulatory Visit | Attending: General Surgery | Admitting: General Surgery

## 2010-12-20 ENCOUNTER — Encounter (HOSPITAL_COMMUNITY): Payer: Self-pay

## 2010-12-20 ENCOUNTER — Ambulatory Visit (HOSPITAL_COMMUNITY)
Admission: RE | Admit: 2010-12-20 | Discharge: 2010-12-20 | Disposition: A | Discharge status: Home / Self Care | Payer: Medicare Other | Source: Ambulatory Visit | Attending: General Surgery | Admitting: General Surgery

## 2010-12-20 ENCOUNTER — Other Ambulatory Visit: Payer: Self-pay

## 2010-12-20 DIAGNOSIS — I517 Cardiomegaly: Secondary | ICD-10-CM | POA: Insufficient documentation

## 2010-12-20 DIAGNOSIS — K4090 Unilateral inguinal hernia, without obstruction or gangrene, not specified as recurrent: Secondary | ICD-10-CM | POA: Insufficient documentation

## 2010-12-20 DIAGNOSIS — Z01810 Encounter for preprocedural cardiovascular examination: Secondary | ICD-10-CM | POA: Insufficient documentation

## 2010-12-20 DIAGNOSIS — Z01812 Encounter for preprocedural laboratory examination: Secondary | ICD-10-CM | POA: Insufficient documentation

## 2010-12-20 HISTORY — DX: Nocturia: R35.1

## 2010-12-20 HISTORY — DX: Hypothyroidism, unspecified: E03.9

## 2010-12-20 HISTORY — DX: Snoring: R06.83

## 2010-12-20 LAB — COMPREHENSIVE METABOLIC PANEL
Albumin: 3.9 g/dL (ref 3.5–5.2)
Alkaline Phosphatase: 101 U/L (ref 39–117)
BUN: 17 mg/dL (ref 6–23)
Calcium: 9.4 mg/dL (ref 8.4–10.5)
Creatinine, Ser: 1.22 mg/dL (ref 0.50–1.35)
GFR calc Af Amer: 60 mL/min — ABNORMAL LOW (ref >90)
Glucose, Bld: 89 mg/dL (ref 70–99)
Total Protein: 6.4 g/dL (ref 6.0–8.3)

## 2010-12-20 LAB — CBC
HCT: 39.9 % (ref 39.0–52.0)
Hemoglobin: 13.6 g/dL (ref 13.0–17.0)
MCH: 32.2 pg (ref 26.0–34.0)
MCHC: 34.1 g/dL (ref 30.0–36.0)
MCV: 94.3 fL (ref 78.0–100.0)
RDW: 12.7 % (ref 11.5–15.5)

## 2010-12-20 LAB — SURGICAL PCR SCREEN
MRSA, PCR: NEGATIVE
Staphylococcus aureus: NEGATIVE

## 2010-12-20 LAB — DIFFERENTIAL
Eosinophils Absolute: 0.2 10*3/uL (ref 0.0–0.7)
Lymphs Abs: 0.9 10*3/uL (ref 0.7–4.0)
Monocytes Relative: 7 % (ref 3–12)
Neutro Abs: 2.9 10*3/uL (ref 1.7–7.7)
Neutrophils Relative %: 67 % (ref 43–77)

## 2010-12-20 NOTE — Patient Instructions (Signed)
20 Ronald Roberts  12/20/2010   Your procedure is scheduled on 12/28/10  Report to [facility] at 10:00 AM.  Call this number if you have problems the morning of surgery: [phone]   Remember:   Do not eat food:After Midnight.  Do not drink clear liquids: After Midnight.  Take these medicines the morning of surgery with A SIP OF WATER: SYNTHROID    Do not wear jewelry, make-up or nail polish.  Do not wear lotions, powders, or perfumes. You may wear deodorant.  Do not shave 48 hours prior to surgery.  Do not bring valuables to the hospital.  Contacts, dentures or bridgework may not be worn into surgery.  Leave suitcase in the car. After surgery it may be brought to your room.  For patients admitted to the hospital, checkout time is 11:00 AM the day of discharge.   Patients discharged the day of surgery will not be allowed to drive home.  Name and phone number of your driver: WIFE  Special Instructions: CHG Shower Use Special Wash: 1/2 bottle night before surgery and 1/2 bottle morning of surgery.   Please read over the following fact sheets that you were given: MRSA Information

## 2010-12-28 ENCOUNTER — Ambulatory Visit (HOSPITAL_COMMUNITY): Payer: Medicare Other | Admitting: Anesthesiology

## 2010-12-28 ENCOUNTER — Encounter (HOSPITAL_COMMUNITY): Payer: Self-pay | Admitting: Anesthesiology

## 2010-12-28 ENCOUNTER — Inpatient Hospital Stay (HOSPITAL_COMMUNITY)
Admission: RE | Admit: 2010-12-28 | Discharge: 2010-12-30 | DRG: 352 | Disposition: A | Discharge status: Home / Self Care | Payer: Medicare Other | Source: Ambulatory Visit | Attending: General Surgery | Admitting: General Surgery

## 2010-12-28 ENCOUNTER — Encounter (HOSPITAL_COMMUNITY): Payer: Self-pay | Admitting: *Deleted

## 2010-12-28 ENCOUNTER — Other Ambulatory Visit (INDEPENDENT_AMBULATORY_CARE_PROVIDER_SITE_OTHER): Payer: Self-pay | Admitting: General Surgery

## 2010-12-28 ENCOUNTER — Encounter (HOSPITAL_COMMUNITY)
Admission: RE | Disposition: A | Discharge status: Home / Self Care | Payer: Self-pay | Source: Ambulatory Visit | Attending: General Surgery

## 2010-12-28 ENCOUNTER — Other Ambulatory Visit: Payer: Self-pay

## 2010-12-28 DIAGNOSIS — I4891 Unspecified atrial fibrillation: Secondary | ICD-10-CM | POA: Diagnosis present

## 2010-12-28 DIAGNOSIS — Z79899 Other long term (current) drug therapy: Secondary | ICD-10-CM

## 2010-12-28 DIAGNOSIS — Z96649 Presence of unspecified artificial hip joint: Secondary | ICD-10-CM

## 2010-12-28 DIAGNOSIS — I482 Chronic atrial fibrillation, unspecified: Secondary | ICD-10-CM | POA: Clinically undetermined

## 2010-12-28 DIAGNOSIS — K4090 Unilateral inguinal hernia, without obstruction or gangrene, not specified as recurrent: Principal | ICD-10-CM | POA: Diagnosis present

## 2010-12-28 HISTORY — PX: INGUINAL HERNIA REPAIR: SHX194

## 2010-12-28 SURGERY — REPAIR, HERNIA, INGUINAL, ADULT
Anesthesia: General | Laterality: Left

## 2010-12-28 MED ORDER — PHENYTOIN SODIUM EXTENDED 100 MG PO CAPS
300.0000 mg | ORAL_CAPSULE | Freq: Three times a day (TID) | ORAL | Status: DC
  Administered 2010-12-28 – 2010-12-29 (×2): 300 mg via ORAL
  Filled 2010-12-28 (×8): qty 3

## 2010-12-28 MED ORDER — LACTATED RINGERS IV SOLN
INTRAVENOUS | Status: DC | PRN
  Administered 2010-12-28 (×2): via INTRAVENOUS

## 2010-12-28 MED ORDER — LACTATED RINGERS IV SOLN: INTRAVENOUS | Status: DC

## 2010-12-28 MED ORDER — PROPOFOL 10 MG/ML IV EMUL
INTRAVENOUS | Status: DC | PRN
  Administered 2010-12-28: 150 mg via INTRAVENOUS
  Administered 2010-12-28: 50 mg via INTRAVENOUS

## 2010-12-28 MED ORDER — FENTANYL CITRATE 0.05 MG/ML IJ SOLN
25.0000 ug | INTRAMUSCULAR | Status: AC | PRN
  Administered 2010-12-28 (×2): 25 ug via INTRAVENOUS
  Administered 2010-12-28: 50 ug via INTRAVENOUS
  Administered 2010-12-28 (×2): 25 ug via INTRAVENOUS
  Administered 2010-12-28: 50 ug via INTRAVENOUS

## 2010-12-28 MED ORDER — OXYCODONE HCL 5 MG PO TABS: 5.0000 mg | ORAL_TABLET | ORAL | Status: DC | PRN

## 2010-12-28 MED ORDER — ACETAMINOPHEN 10 MG/ML IV SOLN
INTRAVENOUS | Status: DC | PRN
  Administered 2010-12-28: 1000 mg via INTRAVENOUS

## 2010-12-28 MED ORDER — ONDANSETRON HCL 4 MG/2ML IJ SOLN: 4.0000 mg | Freq: Four times a day (QID) | INTRAMUSCULAR | Status: DC | PRN

## 2010-12-28 MED ORDER — HYDROMORPHONE HCL PF 1 MG/ML IJ SOLN
0.2500 mg | INTRAMUSCULAR | Status: DC | PRN
  Administered 2010-12-28 (×2): 0.5 mg via INTRAVENOUS

## 2010-12-28 MED ORDER — MORPHINE SULFATE 2 MG/ML IJ SOLN: 1.0000 mg | INTRAMUSCULAR | Status: DC | PRN

## 2010-12-28 MED ORDER — FENTANYL CITRATE 0.05 MG/ML IJ SOLN
INTRAMUSCULAR | Status: DC | PRN
  Administered 2010-12-28 (×3): 50 ug via INTRAVENOUS

## 2010-12-28 MED ORDER — PROMETHAZINE HCL 25 MG/ML IJ SOLN: 12.5000 mg | Freq: Four times a day (QID) | INTRAMUSCULAR | Status: DC | PRN

## 2010-12-28 MED ORDER — ACETAMINOPHEN 325 MG PO TABS: 650.0000 mg | ORAL_TABLET | ORAL | Status: DC | PRN

## 2010-12-28 MED ORDER — PROMETHAZINE HCL 25 MG/ML IJ SOLN: 6.2500 mg | INTRAMUSCULAR | Status: DC | PRN

## 2010-12-28 MED ORDER — BUPIVACAINE-EPINEPHRINE PF 0.25-1:200000 % IJ SOLN
INTRAMUSCULAR | Status: DC | PRN
  Administered 2010-12-28: 15 mL

## 2010-12-28 MED ORDER — HALOPERIDOL 1 MG PO TABS
1.0000 mg | ORAL_TABLET | Freq: Once | ORAL | Status: AC
  Administered 2010-12-29: 1 mg via ORAL
  Filled 2010-12-28: qty 1

## 2010-12-28 MED ORDER — SODIUM CHLORIDE 0.9 % IR SOLN
Status: DC | PRN
  Administered 2010-12-28: 1000 mL

## 2010-12-28 MED ORDER — LEVOTHYROXINE SODIUM 50 MCG PO TABS
50.0000 ug | ORAL_TABLET | ORAL | Status: DC
  Administered 2010-12-29 – 2010-12-30 (×2): 50 ug via ORAL
  Filled 2010-12-28 (×4): qty 1

## 2010-12-28 MED ORDER — LACTATED RINGERS IV SOLN
INTRAVENOUS | Status: DC
  Administered 2010-12-28: 16:00:00 via INTRAVENOUS

## 2010-12-28 MED ORDER — LIDOCAINE HCL (CARDIAC) 20 MG/ML IV SOLN
INTRAVENOUS | Status: DC | PRN
  Administered 2010-12-28: 100 mg via INTRAVENOUS

## 2010-12-28 MED ORDER — ACETAMINOPHEN 650 MG RE SUPP: 650.0000 mg | RECTAL | Status: DC | PRN

## 2010-12-28 MED ORDER — CEFAZOLIN SODIUM 1-5 GM-% IV SOLN
1.0000 g | INTRAVENOUS | Status: AC
  Administered 2010-12-28: 2 g via INTRAVENOUS

## 2010-12-28 SURGICAL SUPPLY — 48 items
APL SKNCLS STERI-STRIP NONHPOA (GAUZE/BANDAGES/DRESSINGS) ×1
BENZOIN TINCTURE PRP APPL 2/3 (GAUZE/BANDAGES/DRESSINGS) ×2 IMPLANT
BLADE HEX COATED 2.75 (ELECTRODE) ×2 IMPLANT
BLADE SURG 15 STRL LF DISP TIS (BLADE) ×1 IMPLANT
BLADE SURG 15 STRL SS (BLADE) ×2
BLADE SURG SZ10 CARB STEEL (BLADE) IMPLANT
CANISTER SUCTION 2500CC (MISCELLANEOUS) ×2 IMPLANT
CLOTH BEACON ORANGE TIMEOUT ST (SAFETY) ×2 IMPLANT
DECANTER SPIKE VIAL GLASS SM (MISCELLANEOUS) ×2 IMPLANT
DISSECTOR ROUND CHERRY 3/8 STR (MISCELLANEOUS) ×2 IMPLANT
DRAIN PENROSE 18X1/2 LTX STRL (DRAIN) ×2 IMPLANT
DRAPE LAPAROTOMY TRNSV 102X78 (DRAPE) ×2 IMPLANT
ELECT REM PT RETURN 9FT ADLT (ELECTROSURGICAL) ×2
ELECTRODE REM PT RTRN 9FT ADLT (ELECTROSURGICAL) ×1 IMPLANT
GLOVE BIOGEL PI IND STRL 7.0 (GLOVE) ×1 IMPLANT
GLOVE BIOGEL PI INDICATOR 7.0 (GLOVE) ×1
GLOVE ORTHO TXT STRL SZ7.5 (GLOVE) ×2 IMPLANT
GOWN PREVENTION PLUS XLARGE (GOWN DISPOSABLE) ×2 IMPLANT
GOWN STRL NON-REIN LRG LVL3 (GOWN DISPOSABLE) ×2 IMPLANT
GOWN STRL REIN XL XLG (GOWN DISPOSABLE) ×2 IMPLANT
KIT BASIN OR (CUSTOM PROCEDURE TRAY) ×2 IMPLANT
MESH PROLENE 3X6 (Mesh General) ×1 IMPLANT
NDL HYPO 25X1 1.5 SAFETY (NEEDLE) ×1 IMPLANT
NEEDLE HYPO 22GX1.5 SAFETY (NEEDLE) ×2 IMPLANT
NEEDLE HYPO 25X1 1.5 SAFETY (NEEDLE) IMPLANT
NS IRRIG 1000ML POUR BTL (IV SOLUTION) ×2 IMPLANT
PACK BASIC VI WITH GOWN DISP (CUSTOM PROCEDURE TRAY) ×2 IMPLANT
PENCIL BUTTON HOLSTER BLD 10FT (ELECTRODE) ×2 IMPLANT
SPONGE GAUZE 4X4 12PLY (GAUZE/BANDAGES/DRESSINGS) ×1 IMPLANT
SPONGE LAP 4X18 X RAY DECT (DISPOSABLE) ×4 IMPLANT
STRIP CLOSURE SKIN 1/2X4 (GAUZE/BANDAGES/DRESSINGS) ×2 IMPLANT
SUT PROLENE 0 CT 1 30 (SUTURE) ×2 IMPLANT
SUT PROLENE 2 0 CT2 30 (SUTURE) ×4 IMPLANT
SUT SURG 0 T 19/GS 22 1969 62 (SUTURE) IMPLANT
SUT SURGILON 1 0 (SUTURE) IMPLANT
SUT VIC AB 2-0 SH 27 (SUTURE) ×2
SUT VIC AB 2-0 SH 27X BRD (SUTURE) ×1 IMPLANT
SUT VIC AB 3-0 54XBRD REEL (SUTURE) ×1 IMPLANT
SUT VIC AB 3-0 BRD 54 (SUTURE) ×2
SUT VIC AB 3-0 SH 27 (SUTURE) ×2
SUT VIC AB 3-0 SH 27XBRD (SUTURE) ×1 IMPLANT
SUT VIC AB 3-0 SH 8-18 (SUTURE) ×1 IMPLANT
SUT VIC AB 4-0 PS2 27 (SUTURE) ×2 IMPLANT
SUT VICRYL 4-0 (SUTURE) ×1 IMPLANT
SYR BULB IRRIGATION 50ML (SYRINGE) ×2 IMPLANT
SYR CONTROL 10ML LL (SYRINGE) ×2 IMPLANT
TAPE CLOTH SURG 4X10 WHT LF (GAUZE/BANDAGES/DRESSINGS) ×1 IMPLANT
YANKAUER SUCT BULB TIP 10FT TU (MISCELLANEOUS) ×2 IMPLANT

## 2010-12-28 NOTE — Interval H&P Note (Signed)
History and Physical Interval Note:   12/28/2010   11:39 AM   Ronald Roberts  has presented today for surgery, with the diagnosis of Inguinal hernia [550.90]  The various methods of treatment have been discussed with the patient and family. After consideration of risks, benefits and other options for treatment, the patient has consented to  Procedure(s): HERNIA REPAIR INGUINAL ADULT as a surgical intervention .  The patients' history has been reviewed, patient examined, no change in status, stable for surgery.  I have reviewed the patients' chart and labs.  Questions were answered to the patient's satisfaction.     Iona Coach  MD Patient eexamined and left inguinal hernia reduced. Abdomen soft. Pt says he has pain in left ankle but not swollen or localized tenderness. Pt is confused but daughter signed permit. Pt has BPH frequent nosturia, plan to leave foley tonight.One or two night hospital stay planned

## 2010-12-28 NOTE — Anesthesia Preprocedure Evaluation (Addendum)
Anesthesia Evaluation  Patient identified by MRN, date of birth, ID band Patient awake    Reviewed: Allergy & Precautions, H&P , NPO status , Patient's Chart, lab work & pertinent test results  Airway Mallampati: II TM Distance: >3 FB Neck ROM: Full    Dental No notable dental hx.    Pulmonary neg pulmonary ROS,  clear to auscultation  Pulmonary exam normal       Cardiovascular neg cardio ROS + Valvular Problems/Murmurs Regular Normal    Neuro/Psych Seizures -,  Negative Neurological ROS  Negative Psych ROS   GI/Hepatic negative GI ROS, Neg liver ROS,   Endo/Other  Negative Endocrine ROSHypothyroidism   Renal/GU negative Renal ROS  Genitourinary negative   Musculoskeletal negative musculoskeletal ROS (+)   Abdominal   Peds negative pediatric ROS (+)  Hematology negative hematology ROS (+)   Anesthesia Other Findings   Reproductive/Obstetrics negative OB ROS                          Anesthesia Physical Anesthesia Plan  ASA: II  Anesthesia Plan: General   Post-op Pain Management:    Induction: Intravenous  Airway Management Planned: LMA and Oral ETT  Additional Equipment:   Intra-op Plan:   Post-operative Plan: Extubation in OR  Informed Consent: I have reviewed the patients History and Physical, chart, labs and discussed the procedure including the risks, benefits and alternatives for the proposed anesthesia with the patient or authorized representative who has indicated his/her understanding and acceptance.   Dental advisory given  Plan Discussed with: CRNA  Anesthesia Plan Comments:         Anesthesia Quick Evaluation

## 2010-12-28 NOTE — H&P (View-Only) (Signed)
Patient ID: Ronald Roberts, male   DOB: [birth year], 77 y.o.   MRN: [id number]  HPI Mr. Ronald Roberts returns is 77 year old patient of Ronald Roberts is at the very large left inguinal hernia was not able to reduce last week for saw the patient and I did get a CT of the abdomen and pelvis there's no bladder and hernia but there was a very large gland and the patient was extremely constipated and I started him on MiraLax which she's been taken down for about 5 days was able to reduce the hernia today course it comes back I very quickly after he stands and the patient states that he would like to go ahead and proceed on getting a hernia repair but we will wait until after Thanksgiving. The patient will will be at least an overnight stay and possibly tonight planning to do general anesthesia her blood we will laminal placement of Foley catheter since his hernias are large and a brother that her catheter will be removed that day over the next morning and will be decided postoperatively I want the patient to continue with the MiraLax one or 2 times a day until his herniorrhaphy and we will also let him start a liquid diet to 3 days prior to the actual surgical repair so that there is no way that he is constipated. There was no blood in his stool it's been 5 or 6 years since these had a colonoscopy but I think it would be fine to proceed on with a hernia repair without any focal testing of his colon  Review of Systems Past Surgical History Procedure Date . Prostate surgery [phone] and 2006 . Joint replacement 1995   right hip replacement   No Known Allergies Current Outpatient Prescriptions Medication Sig Dispense Refill . DILANTIN 100 MG ER capsule      . levothyroxine (SYNTHROID, LEVOTHROID) 50 MCG tablet           Objective:  Physical ExamBP 134/68  Pulse 72  Temp(Src) 96.3 F (35.7 C) (Temporal)  Resp 20  Ht 5\' 3"  (1.6 m)  Wt 178 lb 4 oz (80.854 kg)  BMI 31.58 kg/m2 On physical examination today the  patient is a less confused course his bed in the previous and with his AMS issues from his previous meningitis I know what to expect and given the time to anteriorly appropriate questions.Marland Kitchen His wife does most of the anserine that the patient is sorry not any type of discomfort today and his abdomen is soft she says he's had several very large bowel movement since taken to MiraLax appear eyes ears nose and throat essentially negative lungs are clear cardiac normal sinus rhythm abdomen tender protuberant abdomen but it is soft bowel sounds are active I was able to reduce the large inguinal hernia this is a football size sac and I did not repeat the rectal examination there is no evidence of any hernia noted on the right and they'll call to schedule her at this time and will probably plan a week after Thanksgiving O. the first the following week. They'll start a liquid diet 2-3 days prior to the hernia repair and we'll also have him keep him one or possibly 2 nights after surgery would his postoperative course goes.     Assessment:    Giant left inguinal hernia reducible today    Plan:    Left inguinal hernia repair week after Thanksgiving 1or 2 nights in hospital post op

## 2010-12-28 NOTE — Preoperative (Signed)
Beta Blockers   Reason not to administer Beta Blockers:Not Applicable 

## 2010-12-28 NOTE — Op Note (Signed)
NAME:  GEORDAN, XU NO.:  [id number]  MEDICAL RECORD NO.:  [id number]  LOCATION:  [facility]                         FACILITY:  [facility]  PHYSICIAN:  Meshilem Machuca. Leighanne Adolph, M.D.DATE OF BIRTH:  1922-05-30  DATE OF PROCEDURE: DATE OF DISCHARGE:                              OPERATIVE REPORT   PREOPERATIVE DIAGNOSIS:  Large left inguinal hernia.  OPERATION:  Left inguinal herniorrhaphy with mesh such as big scrotal hernia direct.  HISTORY:  Lorin Gawron is an 77 year old Caucasian male who was referred to me by Dr. Burton Apley with the following history.  The patient has had a large left inguinal hernia for years, but recently it has become much larger in size and he saw Dr. Su Hilt for his annual physical examination and because of the size of the hernia, about the size of a football, he thought that he needed to see a Careers adviser.  The patient has had some neurological issues following a spinal infection about 6 years ago, and he had a seizure and has been on Dilantin.  He has also had some problems of atrial fibrillation for which he was seen by [facility] and cleared prior to a prostate surgery probably 2 years ago.  Dr. Su Hilt felt that we could just go ahead and proceed with his hernia repair.  I did get a CT when I originally saw him in the office.  I could not reduce the hernia, but the patient was put on a liquid diet.  He was extremely constipated and after the colon had been cleansed with the laxatives etc., I was then able to reduce the hernia. The CT showed that he had an enormous left hernia that looks like it is a direct and the colon course was in the hernia sac at the time of the CT, but I reduced him in the office after I had the CT, and he does not have colon obviously in the hernia sac at this time.  The patient preoperatively had IV started.  He was mildly confused and was complaining of some pain in his left ankle, but his ankle  looked unremarkable to me.  He was given 2 g of Ancef and taken back to the operative suite.  Dr. Okey Dupre was the anesthesiologist.  After induction, LMA tube was placed.  The patient's left groin area was shaved, clipped, and then prepped with Betadine surgical scrub and solution.  A Foley catheter was inserted sterilely and placed to straight drainage.  The patient was draped in a sterile manner.  The time-out was completed, then a left inguinal incision was made.  Sharp dissection at the skin, subcutaneous, Scarpa fascia, and then the hernia sac.  The cord was separated from the GB fossa and the scrotal tissues and the cord structures were elevated and separated from this enormous large direct inguinal hernia.  I freed up the hernia sac circumferentially after it was removed or separated off the cord structures.  I had basically separated the floor of the inguinal canal and removed the hernia sac.  I then closed the hernia sac with a running 2-0 Vicryl under direct vision making care that the bladder was not being  caught in this big direct inguinal hernia.  At the time of the CT, we could not see any evidence of bladder up in the actual hernia sac.  The colon across had been reduced and I freed up the hernia sac and removed and sent it for permanent pathology exam.  I then closed the floor with a running 0 Prolene, a kind of a direct repair.  I then used a piece of Prolene mesh shaped like a sail and slit laterally suturing it to the inguinal ligament inferior and up under the external oblique superiorly to kind of reinforce the 0 Prolene continuous repair.  The external oblique, really I could not close that for this was such an enormous hernia and the Scarpa fascia was closed with 3-0 Vicryl.  Benzoin and Steri-Strips after 4-0 Vicryl subcuticular were placed along the incision.  The patient tolerated the procedure well and was taken to the recovery room in stable postop condition.   The anesthesiologist noticed that really after induction of anesthesia, the patient, who had a junctional rhythm preoperatively kind of was in atrial fibrillation, but he was hemodynamically stable throughout the procedure.  ESTIMATED BLOOD LOSS:  Minimal.  All of the little questionable areas were tied when I was separating the hernia sac etc. and hopefully, he will have no bleeding postoperatively and we will wait and now plan on trying to remove the catheter until tomorrow since the patient says he goes to the bathroom up to 7 times a night.  His wife says he goes about 2 to 3 times and does not appear to have any real problems with voiding according to his wife.     Anselm Pancoast. Zachery Dakins, M.D.     WJW/MEDQ  D:  12/28/2010  T:  12/28/2010  Job:  [id number]  cc:   Antony Madura, M.D. Fax: [phone]

## 2010-12-28 NOTE — Transfer of Care (Signed)
Immediate Anesthesia Transfer of Care Note  Patient: Ronald Roberts  Procedure(s) Performed:  HERNIA REPAIR INGUINAL ADULT - With Mesh  Patient Location: PACU  Anesthesia Type: General  Level of Consciousness: sedated  Airway & Oxygen Therapy: Patient Spontanous Breathing and Patient connected to face mask oxygen  Post-op Assessment: Report given to PACU RN and Post -op Vital signs reviewed and stable  Post vital signs: Reviewed and stable  Complications: No apparent anesthesia complications

## 2010-12-28 NOTE — Brief Op Note (Signed)
12/28/2010  2:26 PM  PATIENT:  Ronald Roberts  77 y.o. male  PRE-OPERATIVE DIAGNOSIS:  Left Inguinal hernia large scrotal  POST-OPERATIVE DIAGNOSIS:  Left Inguinal hernia direct  PROCEDURE:  Procedure(s): HERNIA REPAIR INGUINAL ADULT with mesh  SURGEON:  Surgeon(s): Iona Coach, MD  PHYSICIAN ASSISTANT: none  ASSISTANTS: none   ANESTHESIA:   general  EBL:  Total I/O In: 1000 [I.V.:1000] Out: 250 [Urine:200; Blood:50]  BLOOD ADMINISTERED:none  DRAINS: none   LOCAL MEDICATIONS USED:  MARCAINE 20CC  SPECIMEN:  Source of Specimen:  hernia sac  DISPOSITION OF SPECIMEN:  PATHOLOGY  COUNTS:  YES  TOURNIQUET:  * No tourniquets in log *  DICTATION: .Other Dictation: Dictation Number [id number]  PLAN OF CARE: Admit to inpatient   PATIENT DISPOSITION:  PACU - hemodynamically stable.   Delay start of Pharmacological VTE agent (>24hrs) due to surgical blood loss or risk of bleeding:  {YES/NO/NOT APPLICABLE:20182

## 2010-12-28 NOTE — Anesthesia Postprocedure Evaluation (Signed)
  Anesthesia Post-op Note  Patient: Ronald Roberts  Procedure(s) Performed:  HERNIA REPAIR INGUINAL ADULT - With Mesh  Patient Location: PACU  Anesthesia Type: General  Level of Consciousness: awake and alert   Airway and Oxygen Therapy: Patient Spontanous Breathing  Post-op Pain: mild  Post-op Assessment: Post-op Vital signs reviewed, Patient's Cardiovascular Status Stable, Respiratory Function Stable, Patent Airway and No signs of Nausea or vomiting  Post-op Vital Signs: stable  Complications: No apparent anesthesia complications

## 2010-12-29 LAB — CBC
Platelets: 142 10*3/uL — ABNORMAL LOW (ref 150–400)
RBC: 4.19 MIL/uL — ABNORMAL LOW (ref 4.22–5.81)
WBC: 10.2 10*3/uL (ref 4.0–10.5)

## 2010-12-29 MED ORDER — VITAMINS A & D EX OINT
TOPICAL_OINTMENT | CUTANEOUS | Status: AC
  Filled 2010-12-29: qty 5

## 2010-12-29 MED ORDER — HALOPERIDOL 1 MG PO TABS
1.0000 mg | ORAL_TABLET | Freq: Once | ORAL | Status: AC
  Administered 2010-12-30: 1 mg via ORAL
  Filled 2010-12-29: qty 1

## 2010-12-29 MED ORDER — ENOXAPARIN SODIUM 40 MG/0.4ML ~~LOC~~ SOLN
40.0000 mg | SUBCUTANEOUS | Status: DC
  Administered 2010-12-29 – 2010-12-30 (×2): 40 mg via SUBCUTANEOUS
  Filled 2010-12-29 (×2): qty 0.4

## 2010-12-29 NOTE — Progress Notes (Signed)
1 Day Post-Op  Subjective: 1st post op giant left inguinal hernia on elderly male. Developed slow atrial fib with anesthia. VS stable  Objective: Vital signs in last 24 hours: Temp:  [96.7 F (35.9 C)-98.9 F (37.2 C)] 98.9 F (37.2 C) (11/28 0601) Pulse Rate:  [61-84] 67  (11/28 0601) Resp:  [15-19] 18  (11/28 0601) BP: (100-147)/(46-81) 100/52 mmHg (11/28 0601) SpO2:  [96 %-100 %] 100 % (11/28 0601) Weight:  [169 lb 12.1 oz (77 kg)] 169 lb 12.1 oz (77 kg) (11/27 1729) Last BM Date:  (pt unable to tell)  Intake/Output this shift:    Physical Exam: BP 100/52  Pulse 67  Temp(Src) 98.9 F (37.2 C) (Oral)  Resp 18  Ht 5' 4.5" (1.638 m)  Wt 169 lb 12.1 oz (77 kg)  BMI 28.69 kg/m2  SpO2 100% Abdomen soft minimal swelling hernia site. Has foley which we will remove urine clear. Will switch iv to heparin lock . If unable to void will replace foley.  Labs: CBC  Basename 12/29/10 0550  WBC 10.2  HGB 13.3  HCT 39.7  PLT 142*   BMET No results found for this basename: NA:2,K:2,CL:2,CO2:2,GLUCOSE:2,BUN:2,CREATININE:2,CALCIUM:2 in the last 72 hours LFT No results found for this basename: PROT,ALBUMIN,AST,ALT,ALKPHOS,BILITOT,BILIDIR,IBILI,LIPASE in the last 72 hours PT/INR No results found for this basename: LABPROT:2,INR:2 in the last 72 hours ABG No results found for this basename: PHART:2,PCO2:2,PO2:2,HCO3:2 in the last 72 hours  Studies/Results: No results found.  Assessment: Active Problems:  * No active hospital problems. *    Procedure(s): HERNIA REPAIR INGUINAL ADULT  Plan: DC foley replace if needed watch atrial fib. Select diet. Possible discharge  Tomorrow. I will talk with Dr Zenaida Deed adout atrial fib this is a reoccurring problem  LOS: 1 day    Ronald Roberts,Ronald Roberts 12/29/2010

## 2010-12-29 NOTE — Progress Notes (Signed)
BP 116/65  Pulse 87  Temp(Src) 99.3 F (37.4 C) (Oral)  Resp 18  Ht 5' 4.5" (1.638 m)  Wt 169 lb 12.1 oz (77 kg)  BMI 28.69 kg/m2  SpO2 95%  Good day pt able to void with foley out. Dressing dry. 1degree temp encouraged to walk and cough. Slight left scrotal swelling. Wife will stay with pt tonight.  slow atrial fib. Presently not confused.

## 2010-12-29 NOTE — Progress Notes (Signed)
12/29/10 0315 Pt continues to be confused and combative at various times. Administered Haldol 1mg  at 0315 for anxiety. Used other comfort measures beforehand; (i.e. gentle persuasion, therapeutic touch) to no avail. Will reassess pt in 1 hr for efficacy   .me

## 2010-12-30 ENCOUNTER — Encounter (HOSPITAL_COMMUNITY): Payer: Self-pay | Admitting: General Surgery

## 2010-12-30 DIAGNOSIS — I482 Chronic atrial fibrillation, unspecified: Secondary | ICD-10-CM | POA: Clinically undetermined

## 2010-12-30 DIAGNOSIS — K4090 Unilateral inguinal hernia, without obstruction or gangrene, not specified as recurrent: Principal | ICD-10-CM | POA: Diagnosis present

## 2010-12-30 MED ORDER — OXYCODONE HCL 5 MG PO TABS: 5.0000 mg | ORAL_TABLET | Freq: Four times a day (QID) | ORAL | Status: AC | PRN

## 2010-12-30 NOTE — Progress Notes (Signed)
2 Days Post-Op  Subjective: 2 nd post op repair large left scrotal hernia  Objective: Vital signs in last 24 hours: Temp:  [98.9 F (37.2 C)-99.3 F (37.4 C)] 98.9 F (37.2 C) (11/29 4098) Pulse Rate:  [76-87] 84  (11/29 0608) Resp:  [18] 18  (11/29 0608) BP: (116-128)/(64-65) 123/64 mmHg (11/29 0608) SpO2:  [95 %-96 %] 96 % (11/29 0608) Last BM Date:  (Unknown)  Intake/Output this shift: Total I/O In: 240 [P.O.:240] Out: -   Physical Exam: BP 123/64  Pulse 84  Temp(Src) 98.9 F (37.2 C) (Oral)  Resp 18  Ht 5' 4.5" (1.638 m)  Wt 169 lb 12.1 oz (77 kg)  BMI 28.69 kg/m2  SpO2 96% abd soft passed flatus no BM voiding without problems wound fine wife stayed with pt last night feel he will do better at home Telemetry slow atrial fib no alarms and this is probablely pt usual rate  Labs: CBC  Basename 12/29/10 0550  WBC 10.2  HGB 13.3  HCT 39.7  PLT 142*   BMET No results found for this basename: NA:2,K:2,CL:2,CO2:2,GLUCOSE:2,BUN:2,CREATININE:2,CALCIUM:2 in the last 72 hours LFT No results found for this basename: PROT,ALBUMIN,AST,ALT,ALKPHOS,BILITOT,BILIDIR,IBILI,LIPASE in the last 72 hours PT/INR No results found for this basename: LABPROT:2,INR:2 in the last 72 hours ABG No results found for this basename: PHART:2,PCO2:2,PO2:2,HCO3:2 in the last 72 hours  Studies/Results: No results found.  Assessment: Active Problems:  * No active hospital problems. *    Procedure(s): HERNIA REPAIR INGUINAL ADULT  Plan: Discharge later today see me in office nest week. Occasional vicodin if needed has not requested pain meds  LOS: 2 days    Ronald Roberts,Ronald Roberts 12/30/2010

## 2010-12-30 NOTE — Discharge Summary (Signed)
Physician Discharge Summary  Patient ID: Ronald Roberts MRN: [id number] DOB/AGE: Oct 14, 1922 77 y.o.  Admit date: 12/28/2010 Discharge date: 12/30/2010  Admission Diagnoses:large left inguinal difficult to reduce  Discharge Diagnoses:  Principal Problem:  *Inguinal hernia without mention of obstruction or gangrene, unilateral or unspecified, (not specified as recurrent) Active Problems:  Chronic atrial fibrillation   Discharged Condition: good  Hospital Course: [name] was taken to surgery for repair of a large scrotal left anal hernia was unable to reduce preoperatively in the waiting area the procedure was tolerated nicely it was a very large direct inguinal hernia that was repaired with mesh on lay to reinforce the floor during surgery the patient was noted to John from a junctional rhythm into a slow atrial fibrillation and the anesthesiologist recommended we place him telemetry postoperatively. The postop telemetry course has been uneventful with a pulse rate of approximate 70 with a slow atrial fibrillation with allarms sounding the patient had a Foley catheter which was inserted preoperatively but was removed on the first postoperative morning and he has been able to void without problems. the patient's wife or daughter has stated him during this hospitalization and at night he does become confused and has received Haldol on 2 occasions. Last night his wife started him no Haldol and he was not confused and she feels she can better manage him at home at this time and is willing to take him home today. His incision is healing nicely he's got minimal swelling in the scrotum and the highest temperature recorded postoperatively was 99. He received 2 g of Ancef preoperatively but no antibiotics since then and he did not require any repeat catheterization after his Foley was originally removed  Consults: none  Significant Diagnostic Studies: none  Treatments: surgery: left inguinal hernia  repair      Disposition: home   Current Discharge Medication List    START taking these medications   Details  oxyCODONE (OXY IR/ROXICODONE) 5 MG immediate release tablet Take 1 tablet (5 mg total) by mouth every 6 (six) hours as needed for pain (use only if Tylenol not adequate). Qty: 15 tablet, Refills: 0      CONTINUE these medications which have NOT CHANGED   Details  DILANTIN 100 MG ER capsule Take 300 mg by mouth 3 (three) times daily.     levothyroxine (SYNTHROID, LEVOTHROID) 50 MCG tablet Take 50 mcg by mouth every morning.       STOP taking these medications     haloperidol (HALDOL) 1 MG tablet        Follow-up Information    Follow up with Iona Coach, MD in 1 week. (if any problem  or fever call to be seen sooner)    Contact information:   [facility] [address] [place] [address] [phone]          Signed: DETRAVION, TESTER 12/30/2010, 11:27 AM

## 2011-01-06 ENCOUNTER — Encounter (INDEPENDENT_AMBULATORY_CARE_PROVIDER_SITE_OTHER): Payer: Self-pay | Admitting: General Surgery

## 2011-01-06 ENCOUNTER — Ambulatory Visit (INDEPENDENT_AMBULATORY_CARE_PROVIDER_SITE_OTHER): Payer: Medicare Other | Admitting: General Surgery

## 2011-01-06 DIAGNOSIS — K4090 Unilateral inguinal hernia, without obstruction or gangrene, not specified as recurrent: Secondary | ICD-10-CM

## 2011-01-06 NOTE — Progress Notes (Signed)
Subjective:     Patient ID: Ronald Roberts, male   DOB: 1922/11/11, 77 y.o.   MRN: [id number]  HPIMr. Ronald Roberts returns he is now approximately 2 weeks following repair of giant left anal hernia twice in the colon back intra-abdominally and he was discharged to 20 follows after his surgery for Ronald Roberts his wife says he's doing well the Steri-Strips in place have come off think he needs a little reinforcement medially as it is just a little bit of skin separation. His testicle is nonswollen there is no obvious inflammation and his wife says that he still get up to 3 times a night to pass his urine which is normal for him but he is not have any problems with constipation I would recommend that he continue taking the MiraLax and I gave him a slipped and he can do his usual activities. He does not drive but he does attend some type of group function they would be fine to resume next week his wife couldn't keep Steri-Strips on the incision gave her 2 packs and he'll see [place] in followup in approximately 2 weeks   Review of Systems Current Outpatient Prescriptions  Medication Sig Dispense Refill  . DILANTIN 100 MG ER capsule Take 300 mg by mouth 3 (three) times daily.       Marland Kitchen levothyroxine (SYNTHROID, LEVOTHROID) 50 MCG tablet Take 50 mcg by mouth every morning.       Marland Kitchen oxyCODONE (OXY IR/ROXICODONE) 5 MG immediate release tablet Take 1 tablet (5 mg total) by mouth every 6 (six) hours as needed for pain (use only if Tylenol not adequate).  15 tablet  0        Objective:   Physical Exam BP 132/76  Pulse 60  Temp(Src) 97.4 F (36.3 C) (Temporal)  Resp 16  Ht 5\' 4"  (1.626 m)  Wt 177 lb 8 oz (80.513 kg)  BMI 30.47 kg/m2 Postvoid returns he is doing well following examination his abdomen is soft there's just a little bit of separation in median aspect of his incision and I replaced the Steri-Strips which were not worn. His testicle is nonswollen and his wife says that his bowels are working satisfactory  with the help of MiraLax I'll plan on seeing him in 2 weeks and she'll call if he needs to be seen sooner     Assessment:     Satisfactory post op large LIH    Plan:     Return 2 weeks

## 2011-01-21 ENCOUNTER — Ambulatory Visit (INDEPENDENT_AMBULATORY_CARE_PROVIDER_SITE_OTHER): Payer: Medicare Other | Admitting: General Surgery

## 2011-01-21 ENCOUNTER — Encounter (INDEPENDENT_AMBULATORY_CARE_PROVIDER_SITE_OTHER): Payer: Self-pay | Admitting: General Surgery

## 2011-01-21 VITALS — BP 112/68 | HR 66 | Temp 96.8°F | Resp 18 | Ht 68.0 in | Wt 175.0 lb

## 2011-01-21 DIAGNOSIS — K4090 Unilateral inguinal hernia, without obstruction or gangrene, not specified as recurrent: Secondary | ICD-10-CM

## 2011-01-21 NOTE — Patient Instructions (Signed)
Continue places Steri-Strips on the lower portionof the incision for the next 2 weeks and if there is any issues with your incision call Zella Ball and she will arrange for one of my partners to see you. Continue mirolax  Merry Christmas

## 2011-01-21 NOTE — Progress Notes (Signed)
Patient ID: Ronald Roberts, male   DOB: 03-30-1922, 77 y.o.   MRN: [id number] BP 112/68  Pulse 66  Temp(Src) 96.8 F (36 C) (Temporal)  Resp 18  Ht 5\' 8"  (1.727 m)  Wt 175 lb (79.379 kg)  BMI 26.61 kg/m2 List towards left inguinal hernia incision appears to be healing nicely there is a slight separation of the medial third of the incision that is contracted and there is no evidence of any underlying infection he still has some thickening in the subcutaneous area where this large hernia was present and his left testicle is soft. He is having regular bowel movements using a MiraLax nearly daily and I do not think it is necessary for him return for followup of one of my partners that if his wife becomes concerns or questions anything he can be seen in one month for a final followup visit.

## 2011-12-19 ENCOUNTER — Encounter: Payer: Self-pay | Admitting: Neurology

## 2012-11-21 ENCOUNTER — Encounter: Payer: Self-pay | Admitting: Neurology

## 2012-11-23 ENCOUNTER — Other Ambulatory Visit: Payer: Self-pay | Admitting: Neurology

## 2012-12-12 ENCOUNTER — Encounter: Payer: Self-pay | Admitting: Neurology

## 2012-12-12 ENCOUNTER — Ambulatory Visit (INDEPENDENT_AMBULATORY_CARE_PROVIDER_SITE_OTHER): Payer: Medicare Other | Admitting: Neurology

## 2012-12-12 ENCOUNTER — Encounter (INDEPENDENT_AMBULATORY_CARE_PROVIDER_SITE_OTHER): Payer: Self-pay

## 2012-12-12 VITALS — BP 132/60 | HR 64 | Wt 178.0 lb

## 2012-12-12 DIAGNOSIS — G4710 Hypersomnia, unspecified: Secondary | ICD-10-CM

## 2012-12-12 DIAGNOSIS — G40209 Localization-related (focal) (partial) symptomatic epilepsy and epileptic syndromes with complex partial seizures, not intractable, without status epilepticus: Secondary | ICD-10-CM

## 2012-12-12 HISTORY — DX: Localization-related (focal) (partial) symptomatic epilepsy and epileptic syndromes with complex partial seizures, not intractable, without status epilepticus: G40.209

## 2012-12-12 MED ORDER — PHENYTOIN SODIUM EXTENDED 100 MG PO CAPS: 200.0000 mg | ORAL_CAPSULE | Freq: Every day | ORAL | Status: DC

## 2012-12-12 MED ORDER — PHENYTOIN 50 MG PO CHEW: 50.0000 mg | CHEWABLE_TABLET | Freq: Every day | ORAL | Status: DC

## 2012-12-12 NOTE — Patient Instructions (Signed)
Epilepsy A seizure (convulsion) is a sudden change in brain function that causes a change in behavior, muscle activity, or ability to remain awake and alert. If a person has recurring seizures, this is called epilepsy. CAUSES  Epilepsy is a disorder with many possible causes. Anything that disturbs the normal pattern of brain cell activity can lead to seizures. Seizure can be caused from illness to brain damage to abnormal brain development. Epilepsy may develop because of:  An abnormality in brain wiring.  An imbalance of nerve signaling chemicals (neurotransmitters).  Some combination of these factors. Scientists are learning an increasing amount about genetic causes of seizures. SYMPTOMS  The symptoms of a seizure can vary greatly from one person to another. These may include:  An aura, or warning that tells a person they are about to have a seizure.  Abnormal sensations, such as abnormal smell or seeing flashing lights.  Sudden, general body stiffness.  Rhythmic jerking of the face, arm, or leg  on one or both sides.  Sudden change in consciousness.  The person may appear to be awake but not responding.  They may appear to be asleep but cannot be awakened.  Grimacing, chewing, lip smacking, or drooling.  Often there is a period of sleepiness after a seizure. DIAGNOSIS  The description you give to your caregiver about what you experienced will help them understand your problems. Equally important is the description by any witnesses to your seizure. A physical exam, including a detailed neurological exam, is necessary. An EEG (electroencephalogram) is a painless test of your brain waves. In this test a diagram is created of your brain waves. These diagrams can be interpreted by a specialist. Pictures of your brain are usually taken with:  An MRI.  A CT scan. Lab tests may be done to look for:  Signs of infection.  Abnormal blood chemistry. PREVENTION  There is no way to  prevent the development of epilepsy. If you have seizures that are typically triggered by an event (such as flashing lights), try to avoid the trigger. This can help you avoid a seizure.  PROGNOSIS  Most people with epilepsy lead outwardly normal lives. While epilepsy cannot currently be cured, for some people it does eventually go away. Most seizures do not cause brain damage. It is not uncommon for people with epilepsy, especially children, to develop behavioral and emotional problems. These problems are sometimes the consequence of medicine for seizures or social stress. For some people with epilepsy, the risk of seizures restricts their independence and recreational activities. For example, some states refuse drivers licenses to people with epilepsy. Most women with epilepsy can become pregnant. They should discuss their epilepsy and the medicine they are taking with their caregivers. Women with epilepsy have a 90 percent or better chance of having a normal, healthy baby. RISKS AND COMPLICATIONS  People with epilepsy are at increased risk of falls, accidents, and injuries. People with epilepsy are at special risk for two life-threatening conditions. These are status epilepticus and sudden unexplained death (extremely rare). Status epilepticus is a long lasting, continuous seizure that is a medical emergency. TREATMENT  Once epilepsy is diagnosed, it is important to begin treatment as soon as possible. For about 80 percent of those diagnosed with epilepsy, seizures can be controlled with modern medicines and surgical techniques. Some antiepileptic drugs can interfere with the effectiveness of oral contraceptives. In 1997, the FDA approved a pacemaker for the brain the (vagus nerve stimulator). This stimulator can be used for   people with seizures that are not well-controlled by medicine. Studies have shown that in some cases, children may experience fewer seizures if they maintain a strict diet. The strict  diet is called the ketogenic diet. This diet is rich in fats and low in carbohydrates. HOME CARE INSTRUCTIONS   Your caregiver will make recommendations about driving and safety in normal activities. Follow these carefully.  Take any medicine prescribed exactly as directed.  Do any blood tests requested to monitor the levels of your medicine.  The people you live and work with should know that you are prone to seizures. They should receive instructions on how to help you. In general, a witness to a seizure should:  Cushion your head and body.  Turn you on your side.  Avoid unnecessarily restraining you.  Not place anything inside your mouth.  Call for local emergency medical help if there is any question about what has occurred.  Keep a seizure diary. Record what you recall about any seizure, especially any possible trigger.  If your caregiver has given you a follow-up appointment, it is very important to keep that appointment. Not keeping the appointment could result in permanent injury and disability. If there is any problem keeping the appointment, you must call back to this facility for assistance. SEEK MEDICAL CARE IF:   You develop signs of infection or other illness. This might increase the risk of a seizure.  You seem to be having more frequent seizures.  Your seizure pattern is changing. SEEK IMMEDIATE MEDICAL CARE IF:   A seizure does not stop after a few moments.  A seizure causes any difficulty in breathing.  A seizure results in a very severe headache.  A seizure leaves you with the inability to speak or use a part of your body. MAKE SURE YOU:   Understand these instructions.  Will watch your condition.  Will get help right away if you are not doing well or get worse. Document Released: 01/17/2005 Document Revised: 04/11/2011 Document Reviewed: 08/29/2012 [employer] Patient Information 2014 [employer].  

## 2012-12-12 NOTE — Progress Notes (Signed)
Reason for visit: Seizures  Ronald Roberts is an 79 y.o. male  History of present illness:  Ronald Roberts is an 79 year old right-handed white male with a history of herpes encephalitis and resulting seizures. The patient has a chronic aphasia, and he has been treated with Dilantin for number of years with good control. The patient has not had any seizures since last seen. The patient has had full blood work done through his primary care physician, with phenytoin level of 19.9. A CBC and a comprehensive metabolic profile was also done. The patient has not had any new medical issues since last seen. The patient returns for an evaluation. The wife goes on to say that there has been a several year history of increased excessive daytime drowsiness. This is getting worse over time, and the patient is unable to stay awake for day to day activities such as going to church. The patient will fall asleep during conversations. The patient snores severely at night. The wife is concerned about the increased daytime drowsiness.  Past Medical History  Diagnosis Date  . Arthritis   . Heart murmur   . Hearing loss   . Blood in urine   . 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 Confusion   . Memory loss   . Aphasia   . Easy bruising   . Seizures     last seizure was   . Nocturia   . Hypothyroidism   . Snores   . Atrial fibrillation   . Localization-related (focal) (partial) epilepsy and epileptic syndromes with complex partial seizures, without mention of intractable epilepsy 12/12/2012  . Hypersomnia, unspecified     Past Surgical History  Procedure Laterality Date  . Prostate surgery  [phone] and 2006  . Joint replacement  1995    right hip replacement   . Inguinal hernia repair  12/28/2010    Procedure: HERNIA REPAIR INGUINAL ADULT;  Surgeon: Iona Coach, MD;  Location: [facility];  Service: General;  Laterality: Left;  With Mesh  . Cataract extraction  Bilateral     History reviewed. No pertinent family history.  Social history:  reports that he has never smoked. He has never used smokeless tobacco. He reports that he does not drink alcohol or use illicit drugs.    Allergies  Allergen Reactions  . Vancomycin Rash    Medications:  Current Outpatient Prescriptions on File Prior to Visit  Medication Sig Dispense Refill  . levothyroxine (SYNTHROID, LEVOTHROID) 50 MCG tablet Take 50 mcg by mouth every morning.        No current facility-administered medications on file prior to visit.    ROS:  Out of a complete 14 system review of symptoms, the patient complains only of the following symptoms, and all other reviewed systems are negative.  Easy bruising, easy bleeding Hearing loss Memory loss Excessive drowsiness, snoring  Blood pressure 132/60, pulse 64, weight 178 lb (80.74 kg).  Physical Exam  General: The patient is alert and cooperative at the time of the examination.  Skin: No significant peripheral edema is noted.   Neurologic Exam  Mental status: The Mini-Mental status examination done today shows a total score of 13/30.  Cranial nerves: Facial symmetry is present. Speech is notable for aphasia. Extraocular movements are full. Visual fields are full.  Motor: The patient has good strength in all 4 extremities.  Sensory examination: Soft touch sensation is symmetric on the face, arms, and  legs.  Coordination: The patient has good finger-nose-finger and heel-to-shin bilaterally.  Gait and station: The patient has a normal gait. Tandem gait was not tested. Romberg is negative. No drift is seen.  Reflexes: Deep tendon reflexes are symmetric.   Assessment/Plan:  1. History seizures under good control  2. History of herpes encephalitis  3. Excessive daytime drowsiness  The patient will be set up for a sleep evaluation for the excessive daytime drowsiness that now appears to be impairing his ability to  perform day-to-day activities. The patient has severe snoring at night. The patient is doing well with his Dilantin, without recurrent seizures. The patient will followup in one year. A prescription for Dilantin was given. The patient will go on vitamin D supplementation.  Marlan Palau MD 12/12/2012 8:27 PM  [facility] [address] [place], [place] [address]  Phone [phone] Fax [phone]

## 2012-12-18 ENCOUNTER — Telehealth: Payer: Self-pay | Admitting: Neurology

## 2012-12-18 DIAGNOSIS — R0683 Snoring: Secondary | ICD-10-CM

## 2012-12-18 DIAGNOSIS — G40909 Epilepsy, unspecified, not intractable, without status epilepticus: Secondary | ICD-10-CM

## 2012-12-18 DIAGNOSIS — E669 Obesity, unspecified: Secondary | ICD-10-CM

## 2012-12-18 DIAGNOSIS — R400 Somnolence: Secondary | ICD-10-CM

## 2012-12-18 NOTE — Telephone Encounter (Signed)
refers patient for attended sleep study.: Dr. Anne Hahn  Height:  5'8  Weight: 178 lbs.  BMI: 31.01 from [employer], because I didn't see BMI in Epic under the 12-12-12 office visit.  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 The patient has severe snoring at night.    Epworth Score: I called and left a message for the patient to callback to the office, so that I may be a score.  Medications: Cholecalciferol (Tab) VITAMIN D 1000 UNITS Take 1,000 Units by mouth daily. Levothyroxine Sodium (Tab) SYNTHROID, LEVOTHROID 50 MCG Take 50 mcg by mouth every morning. Phenytoin (Chew Tab) DILANTIN 50 MG Chew 1 tablet (50 mg total) by mouth daily. Phenytoin Sodium Extended (Cap) DILANTIN 100 MG Take 2 capsules (200 mg total) by mouth at bedtime.    Insurance: Medicare  Please review patient information and submit instructions for scheduling and orders for sleep technologist.  Thank you!

## 2012-12-19 NOTE — Telephone Encounter (Signed)
This patient has a history of seizure disorder, memory loss, status post herpes simplex encephalitis in January 2006 and presents with a history of daytime somnolence in the context of snoring as well as obesity. I will enter a split-night sleep study request with full seizure montage.

## 2012-12-19 NOTE — Telephone Encounter (Signed)
I called and spoke with the patient's spouse to schedule his sleep study. Patient is schedule on12-16-14@8pm  along with spouse.

## 2013-06-26 ENCOUNTER — Telehealth: Payer: Self-pay | Admitting: Neurology

## 2013-06-26 NOTE — Telephone Encounter (Signed)
Patient's wife is calling--states there have been significant changes with patient--patient wants to sleep most of the day--does patient need an appointment?

## 2013-07-02 ENCOUNTER — Ambulatory Visit (INDEPENDENT_AMBULATORY_CARE_PROVIDER_SITE_OTHER): Payer: Medicare Other | Admitting: Neurology

## 2013-07-02 ENCOUNTER — Encounter (INDEPENDENT_AMBULATORY_CARE_PROVIDER_SITE_OTHER): Payer: Self-pay

## 2013-07-02 ENCOUNTER — Encounter: Payer: Self-pay | Admitting: Neurology

## 2013-07-02 VITALS — BP 130/60 | HR 60 | Wt 174.0 lb

## 2013-07-02 DIAGNOSIS — G479 Sleep disorder, unspecified: Secondary | ICD-10-CM

## 2013-07-02 DIAGNOSIS — G40209 Localization-related (focal) (partial) symptomatic epilepsy and epileptic syndromes with complex partial seizures, not intractable, without status epilepticus: Secondary | ICD-10-CM

## 2013-07-02 DIAGNOSIS — Z5181 Encounter for therapeutic drug level monitoring: Secondary | ICD-10-CM

## 2013-07-02 NOTE — Patient Instructions (Signed)
Epilepsy Epilepsy is a disorder in which a person has repeated seizures over time. A seizure is a release of abnormal electrical activity in the brain. Seizures can cause a change in attention, behavior, or the ability to remain awake and alert (altered mental status). Seizures often involve uncontrollable shaking (convulsions).  Most people with epilepsy lead normal lives. However, people with epilepsy are at an increased risk of falls, accidents, and injuries. Therefore, it is important to begin treatment right away. CAUSES  Epilepsy has many possible causes. Anything that disturbs the normal pattern of brain cell activity can lead to seizures. This may include:   Head injury.  Birth trauma.  High fever as a child.  Stroke.  Bleeding into or around the brain.  Certain drugs.  Prolonged low oxygen, such as what occurs after CPR efforts.  Abnormal brain development.  Certain illnesses, such as meningitis, encephalitis (brain infection), malaria, and other infections.  An imbalance of nerve signaling chemicals (neurotransmitters).  SIGNS AND SYMPTOMS  The symptoms of a seizure can vary greatly from one person to another. Right before a seizure, you may have a warning (aura) that a seizure is about to occur. An aura may include the following symptoms:  Fear or anxiety.  Nausea.  Feeling like the room is spinning (vertigo).  Vision changes, such as seeing flashing lights or spots. Common symptoms during a seizure include:  Abnormal sensations, such as an abnormal smell or a bitter taste in the mouth.   Sudden, general body stiffness.   Convulsions that involve rhythmic jerking of the face, arm, or leg on one or both sides.   Sudden change in consciousness.   Appearing to be awake but not responding.   Appearing to be asleep but cannot be awakened.   Grimacing, chewing, lip smacking, drooling, tongue biting, or loss of bowel or bladder control. After a seizure,  you may feel sleepy for a while. DIAGNOSIS  Your health care provider will ask about your symptoms and take a medical history. Descriptions from any witnesses to your seizures will be very helpful in the diagnosis. A physical exam, including a detailed neurological exam, is necessary. Various tests may be done, such as:   An electroencephalogram (EEG). This is a painless test of your brain waves. In this test, a diagram is created of your brain waves. These diagrams can be interpreted by a specialist.  An MRI of the brain.   A CT scan of the brain.   A spinal tap (lumbar puncture, LP).  Blood tests to check for signs of infection or abnormal blood chemistry. TREATMENT  There is no cure for epilepsy, but it is generally treatable. Once epilepsy is diagnosed, it is important to begin treatment as soon as possible. For most people with epilepsy, seizures can be controlled with medicines. The following may also be used:  A pacemaker for the brain (vagus nerve stimulator) can be used for people with seizures that are not well controlled by medicine.  Surgery on the brain. For some people, epilepsy eventually goes away. HOME CARE INSTRUCTIONS   Follow your health care provider's recommendations on driving and safety in normal activities.  Get enough rest. Lack of sleep can cause seizures.  Only take over-the-counter or prescription medicines as directed by your health care provider. Take any prescribed medicine exactly as directed.  Avoid any known triggers of your seizures.  Keep a seizure diary. Record what you recall about any seizure, especially any possible trigger.   Make   sure the people you live and work with know that you are prone to seizures. They should receive instructions on how to help you. In general, a witness to a seizure should:   Cushion your head and body.   Turn you on your side.   Avoid unnecessarily restraining you.   Not place anything inside your  mouth.   Call for emergency medical help if there is any question about what has occurred.   Follow up with your health care provider as directed. You may need regular blood tests to monitor the levels of your medicine.  SEEK MEDICAL CARE IF:   You develop signs of infection or other illness. This might increase the risk of a seizure.   You seem to be having more frequent seizures.   Your seizure pattern is changing.  SEEK IMMEDIATE MEDICAL CARE IF:   You have a seizure that does not stop after a few moments.   You have a seizure that causes any difficulty in breathing.   You have a seizure that results in a very severe headache.   You have a seizure that leaves you with the inability to speak or use a part of your body.  Document Released: 01/17/2005 Document Revised: 11/07/2012 Document Reviewed: 08/29/2012 [employer] Patient Information 2014 [employer].  

## 2013-07-02 NOTE — Progress Notes (Signed)
Reason for visit: Seizures  Ronald Roberts is an 80 y.o. male  History of present illness:  Ronald Roberts is an 80 year old right-handed white male with a history of prior herpes encephalitis and a residual aphasia with a seizure disorder. The patient had been well controlled on Dilantin, and he is running excellent Dilantin levels in the 19 range. He however, has had 3 events within the month of May. The events will start with flushing in the face, pursing of the lips, and he may have automatic behavior such as trying to undress. Following the events, he is wiped out, sleepy, and he will nap for several hours. The patient continues to have excessive daytime drowsiness, snoring at night. The patient was sent for a sleep referral, but he was unable to pursue this secondary to a death in the family. The excessive daytime drowsiness has worsened. He returns to this office for an evaluation. The patient does not operate a motor vehicle.  Past Medical History  Diagnosis Date  . Arthritis   . Heart murmur   . Hearing loss   . Blood in urine   . 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 Confusion   . Memory loss   . Aphasia   . Easy bruising   . Seizures     last seizure was   . Nocturia   . Hypothyroidism   . Snores   . Atrial fibrillation   . Localization-related (focal) (partial) epilepsy and epileptic syndromes with complex partial seizures, without mention of intractable epilepsy 12/12/2012  . Hypersomnia, unspecified     Past Surgical History  Procedure Laterality Date  . Prostate surgery  [phone] and 2006  . Joint replacement  1995    right hip replacement   . Inguinal hernia repair  12/28/2010    Procedure: HERNIA REPAIR INGUINAL ADULT;  Surgeon: Iona CoachWilliam J Weatherly, MD;  Location: [facility];  Service: General;  Laterality: Left;  With Mesh  . Cataract extraction Bilateral     History reviewed. No pertinent family history.  Social history:  reports  that he has never smoked. He has never used smokeless tobacco. He reports that he does not drink alcohol or use illicit drugs.    Allergies  Allergen Reactions  . Vancomycin Rash    Medications:  Current Outpatient Prescriptions on File Prior to Visit  Medication Sig Dispense Refill  . cholecalciferol (VITAMIN D) 1000 UNITS tablet Take 1,000 Units by mouth daily.      Marland Kitchen. levothyroxine (SYNTHROID, LEVOTHROID) 50 MCG tablet Take 50 mcg by mouth every morning.       . phenytoin (DILANTIN) 100 MG ER capsule Take 2 capsules (200 mg total) by mouth at bedtime.  180 capsule  3  . phenytoin (PHENYTOIN INFATABS) 50 MG tablet Chew 1 tablet (50 mg total) by mouth daily.  90 tablet  3   No current facility-administered medications on file prior to visit.    ROS:  Out of a complete 14 system review of symptoms, the patient complains only of the following symptoms, and all other reviewed systems are negative.  Hearing loss, difficulty swallowing Daytime sleepiness, snoring Bruising easily Memory loss, seizure disorder, speech difficulty Moles  Blood pressure 130/60, pulse 60, weight 174 lb (78.926 kg).  Physical Exam  General: The patient is alert and cooperative at the time of the examination.  Skin: No significant peripheral edema is noted.   Neurologic Exam  Mental  status: The Mini-Mental status examination done today shows a total score of 15/30.  Cranial nerves: Facial symmetry is present. Speech is aphasic. Extraocular movements are full. Visual fields are full.  Motor: The patient has good strength in all 4 extremities.  Sensory examination: Soft touch sensation is symmetric on the face, arms, and legs.  Coordination: The patient has good finger-nose-finger and heel-to-shin bilaterally.  Gait and station: The patient has a normal gait. Tandem gait is slightly unsteady, apraxic. Romberg is negative. No drift is seen.  Reflexes: Deep tendon reflexes are  symmetric.   Assessment/Plan:  1. History of herpes encephalitis  2. Seizure disorder  3. Excessive daytime drowsiness  The patient likely has had recurrence of seizures with partial complex features with 3 seizures in the month of May. The patient will undergo a Dilantin level, but if this is well within the therapeutic range, a second anticonvulsant medication may need to be added such as Keppra. The patient will be sent for a sleep evaluation. If the patient does have sleep apnea, this may adversely affect his seizure disorder. He will followup otherwise in 4 months. He is not to operate a motor vehicle.  Marlan Palau MD 07/02/2013 7:09 PM  [facility] [address] [place], [place] [address]  Phone [phone] Fax [phone]

## 2013-07-03 ENCOUNTER — Telehealth: Payer: Self-pay | Admitting: Neurology

## 2013-07-03 LAB — CBC WITH DIFFERENTIAL
BASOS ABS: 0 10*3/uL (ref 0.0–0.2)
BASOS: 0 %
EOS: 4 %
Eosinophils Absolute: 0.2 10*3/uL (ref 0.0–0.4)
HEMATOCRIT: 40.5 % (ref 37.5–51.0)
HEMOGLOBIN: 13.4 g/dL (ref 12.6–17.7)
Immature Grans (Abs): 0 10*3/uL (ref 0.0–0.1)
Immature Granulocytes: 0 %
LYMPHS ABS: 0.8 10*3/uL (ref 0.7–3.1)
LYMPHS: 15 %
MCH: 30.9 pg (ref 26.6–33.0)
MCHC: 33.1 g/dL (ref 31.5–35.7)
MCV: 94 fL (ref 79–97)
MONOCYTES: 9 %
Monocytes Absolute: 0.5 10*3/uL (ref 0.1–0.9)
NEUTROS ABS: 3.8 10*3/uL (ref 1.4–7.0)
Neutrophils Relative %: 72 %
Platelets: 197 10*3/uL (ref 150–379)
RBC: 4.33 x10E6/uL (ref 4.14–5.80)
RDW: 13.3 % (ref 12.3–15.4)
WBC: 5.4 10*3/uL (ref 3.4–10.8)

## 2013-07-03 LAB — PHENYTOIN LEVEL, TOTAL: PHENYTOIN LVL: 19.9 ug/mL (ref 10.0–20.0)

## 2013-07-03 LAB — COMPREHENSIVE METABOLIC PANEL
ALBUMIN: 4.3 g/dL (ref 3.5–4.7)
ALT: 11 IU/L (ref 0–44)
AST: 16 IU/L (ref 0–40)
Albumin/Globulin Ratio: 2.5 (ref 1.1–2.5)
Alkaline Phosphatase: 105 IU/L (ref 39–117)
BUN/Creatinine Ratio: 17 (ref 10–22)
BUN: 19 mg/dL (ref 8–27)
CALCIUM: 8.5 mg/dL — AB (ref 8.6–10.2)
CHLORIDE: 106 mmol/L (ref 97–108)
CO2: 28 mmol/L (ref 18–29)
Creatinine, Ser: 1.13 mg/dL (ref 0.76–1.27)
GFR calc non Af Amer: 57 mL/min/{1.73_m2} — ABNORMAL LOW (ref >59)
GFR, EST AFRICAN AMERICAN: 66 mL/min/{1.73_m2} (ref >59)
GLUCOSE: 92 mg/dL (ref 65–99)
Globulin, Total: 1.7 g/dL (ref 1.5–4.5)
POTASSIUM: 4.7 mmol/L (ref 3.5–5.2)
Sodium: 146 mmol/L — ABNORMAL HIGH (ref 134–144)
TOTAL PROTEIN: 6 g/dL (ref 6.0–8.5)
Total Bilirubin: <0.2 mg/dL (ref 0.0–1.2)

## 2013-07-03 MED ORDER — LEVETIRACETAM 250 MG PO TABS: 250.0000 mg | ORAL_TABLET | Freq: Two times a day (BID) | ORAL | Status: DC

## 2013-07-03 NOTE — Telephone Encounter (Signed)
I called patient, talked with the wife. The Dilantin level is around 19. He has had 3 seizure events witnessed in may of 2015. I will call in low dose Keppra. The patient has been set up to have a sleep evaluation.

## 2013-07-23 ENCOUNTER — Telehealth: Payer: Self-pay | Admitting: Neurology

## 2013-07-23 DIAGNOSIS — R400 Somnolence: Secondary | ICD-10-CM

## 2013-07-23 DIAGNOSIS — R0683 Snoring: Secondary | ICD-10-CM

## 2013-07-23 DIAGNOSIS — G40909 Epilepsy, unspecified, not intractable, without status epilepticus: Secondary | ICD-10-CM

## 2013-07-23 DIAGNOSIS — Z8619 Personal history of other infectious and parasitic diseases: Secondary | ICD-10-CM

## 2013-07-23 NOTE — Telephone Encounter (Signed)
Dr. Nanetta BattyWillis,refers patient for attended sleep study.  Height: 5'8  Weight: 174 lbs.  BMI: 26.61  Past Medical History:  Arthritis  .  Heart murmur  .  Hearing loss  .  Blood in urine  .  Herpes simplex encephalitis  January 2006  aphasia and memory loss  .  Confusion  .  Memory loss  .  Aphasia  .  Easy bruising  .  Seizures  last seizure was  .  Nocturia  .  Hypothyroidism  .  Snores  .  Atrial fibrillation  .  Localization-related (focal) (partial) epilepsy and epileptic syndromes with complex partial seizures, without mention of intractable epilepsy  12/12/2012  .  Hypersomnia, unspecified    Sleep Symptoms:  The patient continues to have excessive daytime drowsiness, snoring at night.   Medications: Cholecalciferol (Tab) VITAMIN D 1000 UNITS Take 1,000 Units by mouth daily. LevETIRAcetam (Tab) KEPPRA 250 MG Take 1 tablet (250 mg total) by mouth 2 (two) times daily. Levothyroxine Sodium (Tab) SYNTHROID, LEVOTHROID 50 MCG Take 50 mcg by mouth every morning. Phenytoin (Chew Tab) DILANTIN 50 MG Chew 1 tablet (50 mg total) by mouth daily. Phenytoin Sodium Extended (Cap) DILANTIN 100 MG Take 2 capsules (200 mg total) by mouth at bedtime.     Insurance: Medicare/[employer]  Dr. Clarisa KindredWillis's Assessment and Plans:  1. History of herpes encephalitis  2. Seizure disorder  3. Excessive daytime drowsiness  The patient likely has had recurrence of seizures with partial complex features with 3 seizures in the month of May. The patient will undergo a Dilantin level, but if this is well within the therapeutic range, a second anticonvulsant medication may need to be added such as Keppra. The patient will be sent for a sleep evaluation. If the patient does have sleep apnea, this may adversely affect his seizure disorder. He will followup otherwise in 4 months. He is not to operate a motor vehicle.   Please review patient information and submit instructions for scheduling and  orders for sleep technologist.  Thank you!

## 2013-08-05 NOTE — Telephone Encounter (Signed)
Sleep study request review: This patient has an underlying medical history of herpes encephalitis, seizure disorder, hearing loss, heart murmur, A. fib, and hypothyroidism, and is referred by Dr. Anne HahnWillis for an attended sleep study due to a report of daytime somnolence and snoring. I will order a split-night sleep study and see the patient in sleep medicine consultation afterwards. Huston FoleySaima Nalla Purdy, MD, PhD [facility])

## 2013-09-29 ENCOUNTER — Ambulatory Visit (INDEPENDENT_AMBULATORY_CARE_PROVIDER_SITE_OTHER): Payer: Medicare Other | Admitting: Neurology

## 2013-09-29 DIAGNOSIS — G4761 Periodic limb movement disorder: Secondary | ICD-10-CM

## 2013-09-29 DIAGNOSIS — R0683 Snoring: Secondary | ICD-10-CM

## 2013-09-29 DIAGNOSIS — G40909 Epilepsy, unspecified, not intractable, without status epilepticus: Secondary | ICD-10-CM

## 2013-09-29 DIAGNOSIS — Z8619 Personal history of other infectious and parasitic diseases: Secondary | ICD-10-CM

## 2013-09-29 DIAGNOSIS — G4710 Hypersomnia, unspecified: Secondary | ICD-10-CM

## 2013-09-29 DIAGNOSIS — G4733 Obstructive sleep apnea (adult) (pediatric): Secondary | ICD-10-CM

## 2013-09-29 DIAGNOSIS — R400 Somnolence: Secondary | ICD-10-CM

## 2013-10-11 ENCOUNTER — Telehealth: Payer: Self-pay | Admitting: Neurology

## 2013-10-11 DIAGNOSIS — I482 Chronic atrial fibrillation, unspecified: Secondary | ICD-10-CM

## 2013-10-11 DIAGNOSIS — G4733 Obstructive sleep apnea (adult) (pediatric): Secondary | ICD-10-CM

## 2013-10-11 NOTE — Telephone Encounter (Signed)
Please call and notify patient's wife, that the recent sleep study confirmed the diagnosis of OSA. He did fairly well with CPAP during the study with significant improvement of the respiratory events. Therefore, I would like start the patient on CPAP at home. I placed the order in the chart.   Arrange for CPAP set up at home through a DME company of patient's choice and fax/route report to PCP and referring MD (if other than PCP).   The patient will also need a follow up appointment with me in 6-8 weeks post set up that has to be scheduled; help the patient schedule this (in a follow-up slot).   Please re-enforce the importance of compliance with treatment and the need for [place] to monitor compliance data.   Once you have spoken to the patient and scheduled the return appointment, you may close this encounter, thanks,   Huston Foley, MD, PhD [facility] ([facility]

## 2013-10-14 NOTE — Telephone Encounter (Signed)
Spoke with patient's wife regarding his recent split night study report.  Patient's wife was relayed the information and informed that her husband would need to be set up on CPAP for nightly use.  Pt was informed that a copy of the test results would be sent to them and a copy would be provided to the referring physician, Dr. Stephanie Acre.  Pt's wife was transferred to [facility] for a 6-8 week follow up visit to be scheduled.

## 2013-10-16 ENCOUNTER — Encounter: Payer: Self-pay | Admitting: Neurology

## 2013-11-14 ENCOUNTER — Other Ambulatory Visit: Payer: Self-pay | Admitting: Neurology

## 2013-11-17 ENCOUNTER — Emergency Department (HOSPITAL_COMMUNITY): Payer: Medicare Other

## 2013-11-17 ENCOUNTER — Inpatient Hospital Stay (HOSPITAL_COMMUNITY)
Admission: EM | Admit: 2013-11-17 | Discharge: 2013-11-20 | DRG: 065 | Disposition: A | Discharge status: Skilled Nursing Facility | Payer: Medicare Other | Attending: Internal Medicine | Admitting: Internal Medicine

## 2013-11-17 ENCOUNTER — Encounter (HOSPITAL_COMMUNITY): Payer: Self-pay | Admitting: Emergency Medicine

## 2013-11-17 DIAGNOSIS — R471 Dysarthria and anarthria: Secondary | ICD-10-CM | POA: Diagnosis present

## 2013-11-17 DIAGNOSIS — I6350 Cerebral infarction due to unspecified occlusion or stenosis of unspecified cerebral artery: Secondary | ICD-10-CM

## 2013-11-17 DIAGNOSIS — R4701 Aphasia: Secondary | ICD-10-CM | POA: Diagnosis present

## 2013-11-17 DIAGNOSIS — R531 Weakness: Secondary | ICD-10-CM | POA: Diagnosis present

## 2013-11-17 DIAGNOSIS — I739 Peripheral vascular disease, unspecified: Secondary | ICD-10-CM | POA: Diagnosis present

## 2013-11-17 DIAGNOSIS — I63432 Cerebral infarction due to embolism of left posterior cerebral artery: Principal | ICD-10-CM | POA: Diagnosis present

## 2013-11-17 DIAGNOSIS — I482 Chronic atrial fibrillation, unspecified: Secondary | ICD-10-CM | POA: Diagnosis present

## 2013-11-17 DIAGNOSIS — Z96641 Presence of right artificial hip joint: Secondary | ICD-10-CM | POA: Diagnosis present

## 2013-11-17 DIAGNOSIS — G40209 Localization-related (focal) (partial) symptomatic epilepsy and epileptic syndromes with complex partial seizures, not intractable, without status epilepticus: Secondary | ICD-10-CM | POA: Diagnosis present

## 2013-11-17 DIAGNOSIS — E785 Hyperlipidemia, unspecified: Secondary | ICD-10-CM | POA: Diagnosis present

## 2013-11-17 DIAGNOSIS — I639 Cerebral infarction, unspecified: Secondary | ICD-10-CM

## 2013-11-17 DIAGNOSIS — Z79899 Other long term (current) drug therapy: Secondary | ICD-10-CM | POA: Diagnosis not present

## 2013-11-17 DIAGNOSIS — Z6829 Body mass index (BMI) 29.0-29.9, adult: Secondary | ICD-10-CM

## 2013-11-17 DIAGNOSIS — G40909 Epilepsy, unspecified, not intractable, without status epilepticus: Secondary | ICD-10-CM

## 2013-11-17 DIAGNOSIS — E669 Obesity, unspecified: Secondary | ICD-10-CM | POA: Diagnosis present

## 2013-11-17 DIAGNOSIS — E039 Hypothyroidism, unspecified: Secondary | ICD-10-CM | POA: Diagnosis present

## 2013-11-17 DIAGNOSIS — Z823 Family history of stroke: Secondary | ICD-10-CM

## 2013-11-17 DIAGNOSIS — Z8619 Personal history of other infectious and parasitic diseases: Secondary | ICD-10-CM | POA: Diagnosis present

## 2013-11-17 DIAGNOSIS — G8191 Hemiplegia, unspecified affecting right dominant side: Secondary | ICD-10-CM | POA: Diagnosis present

## 2013-11-17 LAB — CBC
HCT: 37.7 % — ABNORMAL LOW (ref 39.0–52.0)
Hemoglobin: 12.3 g/dL — ABNORMAL LOW (ref 13.0–17.0)
MCH: 30.8 pg (ref 26.0–34.0)
MCHC: 32.6 g/dL (ref 30.0–36.0)
MCV: 94.3 fL (ref 78.0–100.0)
Platelets: 151 10*3/uL (ref 150–400)
RBC: 4 MIL/uL — ABNORMAL LOW (ref 4.22–5.81)
RDW: 12.8 % (ref 11.5–15.5)
WBC: 4.3 10*3/uL (ref 4.0–10.5)

## 2013-11-17 LAB — COMPREHENSIVE METABOLIC PANEL
ALT: 12 U/L (ref 0–53)
ANION GAP: 8 (ref 5–15)
AST: 16 U/L (ref 0–37)
Albumin: 3.5 g/dL (ref 3.5–5.2)
Alkaline Phosphatase: 93 U/L (ref 39–117)
BUN: 22 mg/dL (ref 6–23)
CALCIUM: 8.4 mg/dL (ref 8.4–10.5)
CO2: 27 meq/L (ref 19–32)
CREATININE: 1.12 mg/dL (ref 0.50–1.35)
Chloride: 104 mEq/L (ref 96–112)
GFR calc non Af Amer: 56 mL/min — ABNORMAL LOW (ref >90)
GFR, EST AFRICAN AMERICAN: 65 mL/min — AB (ref >90)
GLUCOSE: 87 mg/dL (ref 70–99)
Potassium: 4.2 mEq/L (ref 3.7–5.3)
Sodium: 139 mEq/L (ref 137–147)
Total Bilirubin: 0.3 mg/dL (ref 0.3–1.2)
Total Protein: 6.3 g/dL (ref 6.0–8.3)

## 2013-11-17 LAB — DIFFERENTIAL
Basophils Absolute: 0 10*3/uL (ref 0.0–0.1)
Basophils Relative: 0 % (ref 0–1)
EOS PCT: 5 % (ref 0–5)
Eosinophils Absolute: 0.2 10*3/uL (ref 0.0–0.7)
LYMPHS ABS: 0.6 10*3/uL — AB (ref 0.7–4.0)
LYMPHS PCT: 15 % (ref 12–46)
MONOS PCT: 12 % (ref 3–12)
Monocytes Absolute: 0.5 10*3/uL (ref 0.1–1.0)
Neutro Abs: 2.9 10*3/uL (ref 1.7–7.7)
Neutrophils Relative %: 68 % (ref 43–77)

## 2013-11-17 LAB — URINALYSIS, ROUTINE W REFLEX MICROSCOPIC
BILIRUBIN URINE: NEGATIVE
Glucose, UA: NEGATIVE mg/dL
Ketones, ur: NEGATIVE mg/dL
Leukocytes, UA: NEGATIVE
NITRITE: NEGATIVE
PH: 7.5 (ref 5.0–8.0)
Protein, ur: NEGATIVE mg/dL
SPECIFIC GRAVITY, URINE: 1.016 (ref 1.005–1.030)
Urobilinogen, UA: 0.2 mg/dL (ref 0.0–1.0)

## 2013-11-17 LAB — PROTIME-INR
INR: 1.26 (ref 0.00–1.49)
Prothrombin Time: 15.9 seconds — ABNORMAL HIGH (ref 11.6–15.2)

## 2013-11-17 LAB — CBG MONITORING, ED: GLUCOSE-CAPILLARY: 84 mg/dL (ref 70–99)

## 2013-11-17 LAB — URINE MICROSCOPIC-ADD ON

## 2013-11-17 LAB — I-STAT CHEM 8, ED
BUN: 22 mg/dL (ref 6–23)
CREATININE: 1.1 mg/dL (ref 0.50–1.35)
Calcium, Ion: 1.13 mmol/L (ref 1.13–1.30)
Chloride: 104 mEq/L (ref 96–112)
GLUCOSE: 89 mg/dL (ref 70–99)
HEMATOCRIT: 38 % — AB (ref 39.0–52.0)
HEMOGLOBIN: 12.9 g/dL — AB (ref 13.0–17.0)
Potassium: 4.1 mEq/L (ref 3.7–5.3)
SODIUM: 142 meq/L (ref 137–147)
TCO2: 28 mmol/L (ref 0–100)

## 2013-11-17 LAB — I-STAT TROPONIN, ED: Troponin i, poc: 0 ng/mL (ref 0.00–0.08)

## 2013-11-17 LAB — PHENYTOIN LEVEL, TOTAL: Phenytoin Lvl: 20.3 ug/mL — ABNORMAL HIGH (ref 10.0–20.0)

## 2013-11-17 LAB — RAPID URINE DRUG SCREEN, HOSP PERFORMED
Amphetamines: NOT DETECTED
Barbiturates: NOT DETECTED
Benzodiazepines: NOT DETECTED
COCAINE: NOT DETECTED
OPIATES: NOT DETECTED
Tetrahydrocannabinol: NOT DETECTED

## 2013-11-17 LAB — APTT: aPTT: 33 seconds (ref 24–37)

## 2013-11-17 LAB — ETHANOL: Alcohol, Ethyl (B): <11 mg/dL (ref 0–11)

## 2013-11-17 MED ORDER — SODIUM CHLORIDE 0.9 % IV SOLN
250.0000 mg | Freq: Two times a day (BID) | INTRAVENOUS | Status: DC
  Administered 2013-11-17 – 2013-11-18 (×3): 250 mg via INTRAVENOUS
  Filled 2013-11-17 (×5): qty 2.5

## 2013-11-17 MED ORDER — ACETAMINOPHEN 650 MG RE SUPP: 650.0000 mg | RECTAL | Status: DC | PRN

## 2013-11-17 MED ORDER — SODIUM CHLORIDE 0.9 % IV SOLN
INTRAVENOUS | Status: DC
  Administered 2013-11-17: 1000 mL via INTRAVENOUS
  Administered 2013-11-18 (×2): via INTRAVENOUS

## 2013-11-17 MED ORDER — ACETAMINOPHEN 325 MG PO TABS: 650.0000 mg | ORAL_TABLET | ORAL | Status: DC | PRN

## 2013-11-17 MED ORDER — LEVETIRACETAM 250 MG PO TABS: 250.0000 mg | ORAL_TABLET | Freq: Two times a day (BID) | ORAL | Status: DC

## 2013-11-17 MED ORDER — ENOXAPARIN SODIUM 40 MG/0.4ML ~~LOC~~ SOLN
40.0000 mg | SUBCUTANEOUS | Status: DC
  Administered 2013-11-17 – 2013-11-19 (×3): 40 mg via SUBCUTANEOUS
  Filled 2013-11-17 (×3): qty 0.4

## 2013-11-17 MED ORDER — STROKE: EARLY STAGES OF RECOVERY BOOK
Freq: Once | Status: AC
  Administered 2013-11-17: 16:00:00
  Filled 2013-11-17: qty 1

## 2013-11-17 MED ORDER — ASPIRIN 300 MG RE SUPP: 300.0000 mg | Freq: Every day | RECTAL | Status: DC

## 2013-11-17 MED ORDER — PHENYTOIN SODIUM EXTENDED 100 MG PO CAPS: 100.0000 mg | ORAL_CAPSULE | Freq: Every day | ORAL | Status: DC

## 2013-11-17 MED ORDER — ASPIRIN 325 MG PO TABS
325.0000 mg | ORAL_TABLET | Freq: Every day | ORAL | Status: DC
  Administered 2013-11-19 – 2013-11-20 (×2): 325 mg via ORAL
  Filled 2013-11-17 (×2): qty 1

## 2013-11-17 MED ORDER — LEVETIRACETAM IN NACL 1000 MG/100ML IV SOLN
1000.0000 mg | Freq: Once | INTRAVENOUS | Status: AC
  Administered 2013-11-17: 1000 mg via INTRAVENOUS
  Filled 2013-11-17: qty 100

## 2013-11-17 MED ORDER — PHENYTOIN SODIUM 50 MG/ML IJ SOLN
100.0000 mg | Freq: Every day | INTRAMUSCULAR | Status: DC
  Administered 2013-11-18: 100 mg via INTRAVENOUS
  Filled 2013-11-17: qty 2

## 2013-11-17 MED ORDER — LEVOTHYROXINE SODIUM 100 MCG IV SOLR
25.0000 ug | Freq: Every day | INTRAVENOUS | Status: DC
  Administered 2013-11-18: 25 ug via INTRAVENOUS
  Filled 2013-11-17: qty 5

## 2013-11-17 MED ORDER — LEVOTHYROXINE SODIUM 50 MCG PO TABS: 50.0000 ug | ORAL_TABLET | ORAL | Status: DC

## 2013-11-17 NOTE — ED Notes (Signed)
Pt presents to department for evaluation of possible stroke. Family states they saw patient last night @ 8:30pm, pt awoke this morning with increased difficulty speaking and R sided arm weakness. Family states he has difficulty articulating words, but this morning speech was more garbled than usual, upon arrival initial NIHSS of 7. Pt unable to follow commands and answer questions appropriately.

## 2013-11-17 NOTE — ED Notes (Signed)
Pt attempting to urinate.

## 2013-11-17 NOTE — ED Provider Notes (Signed)
CSN: [id number]     Arrival date & time 11/17/13  1022 History   First MD Initiated Contact with Patient 11/17/13 1023     Chief Complaint  Patient presents with  . Code Stroke    An emergency department physician performed an initial assessment on this suspected stroke patient at [id number]. (Consider location/radiation/quality/duration/timing/severity/associated sxs/prior Treatment) HPI Comments: Per wife the patient was normal when he went to bed at 8:30 last night and at 3 AM when he woke up to go to the bathroom he had significant difficulty ambulating to the bathroom which is new per his wife. She was able to help him to the bathroom but when he woke up 2 hours later go to the bathroom again she could not get him out of bed. That is when she noticed significant impairment on his right side. He does have a history of seizures which they describe as being short lived but he has trembling on the right side and then is fatigued for the rest of the day. His last seizure was June but he is still currently taking his Dilantin and Keppra. They deny any recent infections, trauma or cardiac complaints  The history is provided by the spouse.    Past Medical History  Diagnosis Date  . Arthritis   . Heart murmur   . Hearing loss   . Blood in urine   . 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 Confusion   . Memory loss   . Aphasia   . Easy bruising   . Seizures     last seizure was   . Nocturia   . Hypothyroidism   . Snores   . Atrial fibrillation   . Localization-related (focal) (partial) epilepsy and epileptic syndromes with complex partial seizures, without mention of intractable epilepsy 12/12/2012  . Hypersomnia, unspecified    Past Surgical History  Procedure Laterality Date  . Prostate surgery  [phone] and 2006  . Joint replacement  1995    right hip replacement   . Inguinal hernia repair  12/28/2010    Procedure: HERNIA REPAIR INGUINAL ADULT;  Surgeon:  Iona CoachWilliam J Weatherly, MD;  Location: [facility];  Service: General;  Laterality: Left;  With Mesh  . Cataract extraction Bilateral    History reviewed. No pertinent family history. History  Substance Use Topics  . Smoking status: Never Smoker   . Smokeless tobacco: Never Used  . Alcohol Use: No    Review of Systems  All other systems reviewed and are negative.     Allergies  Vancomycin  Home Medications   Prior to Admission medications   Medication Sig Start Date End Date Taking? Authorizing Provider  cholecalciferol (VITAMIN D) 1000 UNITS tablet Take 1,000 Units by mouth daily.   Yes Historical Provider, MD  levETIRAcetam (KEPPRA) 250 MG tablet TAKE 1 TABLET BY MOUTH TWICE DAILY 11/14/13  Yes York Spanielharles K Willis, MD  levothyroxine (SYNTHROID, LEVOTHROID) 50 MCG tablet Take 50 mcg by mouth every morning.  09/11/10  Yes Historical Provider, MD  phenytoin (DILANTIN) 100 MG ER capsule TAKE 2 CAPSULES BY MOUTH AT BEDTIME 11/14/13  Yes York Spanielharles K Willis, MD  PHENYTOIN INFATABS 50 MG tablet CHEW AND SWALLOW ONE TABLET DAILY 11/14/13  Yes York Spanielharles K Willis, MD   BP 169/58  Pulse 57  Resp 18  Ht 5\' 5"  (1.651 m)  Wt 175 lb (79.379 kg)  BMI 29.12 kg/m2  SpO2 99% Physical Exam  Nursing note  and vitals reviewed. Constitutional: He is oriented to person, place, and time. He appears well-developed and well-nourished. No distress.  HENT:  Head: Normocephalic and atraumatic.  Mouth/Throat: Oropharynx is clear and moist.  Eyes: Conjunctivae and EOM are normal. Pupils are equal, round, and reactive to light.  Neck: Normal range of motion. Neck supple.  Cardiovascular: Normal rate and intact distal pulses.  An irregularly irregular rhythm present.  No murmur heard. Pulmonary/Chest: Effort normal and breath sounds normal. No respiratory distress. He has no wheezes. He has no rales.  Abdominal: Soft. He exhibits no distension. There is no tenderness. There is no rebound and no guarding.   Musculoskeletal: Normal range of motion. He exhibits no edema and no tenderness.  Neurological: He is alert and oriented to person, place, and time. He has normal strength.  4/5 strength in the right upper/lower ext.  No pronator drift and 5/5 strength on the left.  Mild aphasia but able to follow commands without difficulty and no visual field cuts.  Skin: Skin is warm and dry. No rash noted. No erythema.  Psychiatric: He has a normal mood and affect. His behavior is normal.    ED Course  Procedures (including critical care time) Labs Review Labs Reviewed  PROTIME-INR - Abnormal; Notable for the following:    Prothrombin Time 15.9 (*)    All other components within normal limits  CBC - Abnormal; Notable for the following:    RBC 4.00 (*)    Hemoglobin 12.3 (*)    HCT 37.7 (*)    All other components within normal limits  DIFFERENTIAL - Abnormal; Notable for the following:    Lymphs Abs 0.6 (*)    All other components within normal limits  COMPREHENSIVE METABOLIC PANEL - Abnormal; Notable for the following:    GFR calc non Af Amer 56 (*)    GFR calc Af Amer 65 (*)    All other components within normal limits  PHENYTOIN LEVEL, TOTAL - Abnormal; Notable for the following:    Phenytoin Lvl 20.3 (*)    All other components within normal limits  I-STAT CHEM 8, ED - Abnormal; Notable for the following:    Hemoglobin 12.9 (*)    HCT 38.0 (*)    All other components within normal limits  ETHANOL  APTT  URINE RAPID DRUG SCREEN (HOSP PERFORMED)  URINALYSIS, ROUTINE W REFLEX MICROSCOPIC  CBG MONITORING, ED  I-STAT TROPOININ, ED  I-STAT TROPOININ, ED    Imaging Review Ct Head Wo Contrast  11/17/2013   CLINICAL DATA:  Code stroke.  EXAM: CT HEAD WITHOUT CONTRAST  TECHNIQUE: Contiguous axial images were obtained from the base of the skull through the vertex without intravenous contrast.  COMPARISON:  09/01/2006.  FINDINGS: Prominence of the sulci and ventricles noted compatible with  brain atrophy. There is a large area of encephalomalacia involving the left temporal lobe compatible with history of prior HSV infection. Areas of low attenuation within the subcortical white matter and periventricular white matter noted compatible with chronic small vessel ischemic disease. There is no evidence for acute intracranial hemorrhage, infarct or mass. The paranasal sinuses are clear.  IMPRESSION: 1. No acute intracranial abnormalities. 2. Left temporal lobe encephalomalacia as before. 3. Small vessel ischemic disease and brain atrophy.   Electronically Signed   By: Signa Kell M.D.   On: 11/17/2013 10:44   Mr Brain Wo Contrast  11/17/2013   CLINICAL DATA:  Initial encounter. Confusion. Speech disturbance. Right arm weakness.  EXAM: MRI  HEAD WITHOUT CONTRAST  TECHNIQUE: Multiplanar, multiecho pulse sequences of the brain and surrounding structures were obtained without intravenous contrast.  COMPARISON:  Head CT same day.  FINDINGS: The study shows significant motion degradation.  There is a 1 cm acute infarction within the left lateral thalamus. No other acute infarction. One could question early low level restricted diffusion in the left parietal cortical brain inferiorly, but this is not definite.  No focal brainstem insult. No focal cerebellar insult. There is old infarction of the left temporal lobe which has progressed to atrophy and encephalomalacia with adjacent gliosis. There is ordinary moderate chronic small-vessel ischemic change within the cerebral hemispheric deep white matter. No large vessel territory infarction. No mass lesion, hemorrhage, hydrocephalus or extra-axial collection. No pituitary mass. No inflammatory sinus disease. No skull or skullbase lesion.  IMPRESSION: 1 cm acute infarction left lateral thalamus.  Question early restricted diffusion in the left posterior temporal cortical brain. This may simply be artifactual, but early ischemia in that area is not completely  ruled out.  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  Chronic small vessel disease.   Electronically Signed   By: Paulina FusiMark  Shogry M.D.   On: 11/17/2013 14:29     EKG Interpretation   Date/Time:  Sunday November 17 2013 10:38:06 EDT Ventricular Rate:  52 PR Interval:    QRS Duration: 115 QT Interval:  483 QTC Calculation: 449 R Axis:   -75 Text Interpretation:  Atrial fibrillation Incomplete right bundle branch  block Inferior infarct, old Consider anterior infarct No significant  change since last tracing Confirmed by Anitra LauthPLUNKETT  MD, Alphonzo LemmingsWHITNEY ([id number]) on  11/17/2013 10:45:42 AM      MDM   Final diagnoses:  Stroke    Patient initially presented via code stroke with right-sided deficits. However when speaking to wife last time the patient was seen normal was 8:30 PM last night which did not make him a TPA candidate given the length of time symptoms have been ongoing. Around 3 AM he was noted to have right-sided deficits which worsened when he woke up a second time. Patient does have a history of seizure disorder and takes Dilantin and Keppra. Wife and daughter have not noticed seizure activity however she does normally have trembling on his right side. On exam here patient does have mild weakness on the right side but is able to follow commands. Neurology at bedside evaluating him initially. Concern for possible new stroke versus seizure.  Code stroke worse at initiated but also will do an MRI for further evaluation. Patient's Dilantin level is mildly elevated and he was given a gram of Keppra. CT without significant findings. I-STAT, troponin, alcohol, INR, CBC, CMP without acute finding.  3:21 PM Labs without significant findings.  MRI with acute stroke.  Will admit for work up  Gwyneth SproutWhitney Rafael Salway, MD 11/17/13 1521

## 2013-11-17 NOTE — Progress Notes (Signed)
Patient received from ED via stretcher. 02 and suction set-up for seizure precaution. Patient unable to follow commands, attempting to disrobe and pulling at therapuetic devices. Wife oriented to unit and call system. Chart and orders reviewed for information. Ronald Roberts. Sullivan MD texted patient room number on arrival per order.

## 2013-11-17 NOTE — Consult Note (Signed)
Neurology Consultation Reason for Consult: Right-sided weakness Referring Physician: Anitra LauthPlunkett, W.  CC: Right-sided weakness  History is obtained from: Patient, family  HPI: Ronald Roberts is a 80 y.o. 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 His wife helped him to get up to go to the bathroom around 3 AM and at that time, he was started experiencing symptoms but she was able to give him there. Following this, he went back to bed and she remained awake. When she went back to check on him later, she was unable to get him up and therefore he was brought via EMS.  At baseline, he has non-dysarthric speech but his words are often nonsensical, though he can at times have simple conversations.  He does have a history of seizures, however he has not had right-sided weakness following previous seizures. Seizures are partial involving his right arm and leg.  LKW: 8:30 PM tpa given?: no, outside of window   ROS: A 14 point ROS was performed and is negative except as noted in the HPI.   Past Medical History  Diagnosis Date  . Arthritis   . Heart murmur   . Hearing loss   . Blood in urine   . 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 Confusion   . Memory loss   . Aphasia   . Easy bruising   . Seizures     last seizure was   . Nocturia   . Hypothyroidism   . Snores   . Atrial fibrillation   . Localization-related (focal) (partial) epilepsy and epileptic syndromes with complex partial seizures, without mention of intractable epilepsy 12/12/2012  . Hypersomnia, unspecified   . Hypothyroidism     Family History: Unable to obtain due to dysarthria/aphasia  Social History: Tob: Never smoker  Exam: Current vital signs: BP 121/47  Pulse 52  Resp 18  Ht 5\' 5"  (1.651 m)  Wt 79.379 kg (175 lb)  BMI 29.12 kg/m2  SpO2 98% Vital signs in last 24  hours: Pulse Rate:  [52-62] 52 (10/18 1452) Resp:  [12-18] 18 (10/18 1452) BP: (121-169)/(47-88) 121/47 mmHg (10/18 1452) SpO2:  [98 %-100 %] 98 % (10/18 1452) Weight:  [79.379 kg (175 lb)] 79.379 kg (175 lb) (10/18 1123)  General: In bed, NAD CV: Regular rate and rhythm Mental Status: Patient is awake, alert, oriented to person, place, gives march as month and 1280 as age. He is able to follow simple commands and answer some simple questions, he makes frequent errors in his speech. Cranial Nerves: II: Visual Fields are full. Pupils are equal, round, and reactive to light.  Discs are difficult to visualize. III,IV, VI: EOMI without ptosis or diploplia.  V: Facial sensation is symmetric to temperature VII: Facial movement is symmetric.  VIII: hearing is intact to voice X: Uvula elevates symmetrically XI: Shoulder shrug is symmetric. XII: tongue is midline without atrophy or fasciculations.  Motor: Tone is normal. Bulk is normal. He has 4-/5 weakness of his right arm and 4/5 weakness of his right leg. Sensory: Sensation is reduced on the right. Deep Tendon Reflexes: 2+ and symmetric in the biceps and patellae.  Plantars: Toes are downgoing bilaterally.  Cerebellar: Patient had difficulty cooperating. No clear ataxia Gait: Not tested secondary to patient safety concerns        I have reviewed labs in epic and the results pertinent to  this consultation are: CMP-unremarkable  I have reviewed the images obtained: MRI brain-small thalamic infarct  Impression: 80 year old male with new thalamic infarct, likely small vessel disease.  Recommendations: 1. HgbA1c, fasting lipid panel 2. MRI, MRA  of the brain without contrast 3. Frequent neuro checks 4. Echocardiogram 5. Carotid dopplers 6. Prophylactic therapy-Antiplatelet med: Aspirin - dose 325mg  PO or 300mg  PR 7. Risk factor modification 8. Telemetry monitoring 9. PT consult, OT consult, Speech consult 10. Continue  Keppra  Ritta SlotMcNeill Sujata Maines, MD [facility] [phone]  If 7pm- 7am, please page neurology on call as listed in AMION.

## 2013-11-17 NOTE — ED Notes (Signed)
Attempted to call report

## 2013-11-17 NOTE — H&P (Addendum)
[facility] History and Physical  Ronald BlanksWilliam S Putzier WGN:[id number] DOB: [birth year] DOA: 11/17/2013  Referring physician: Anitra LauthPlunkett PCP: Lorenda PeckOBERTS, RONALD WAYNE, MD   Chief Complaint: weakness  HPI: Ronald Roberts is a 80 y.o. male   with h/o herpes encephalitis, resulting seizure disorder, chronic atrial fibrillation and hypothyroidism who arrives to ED via EMS as code stroke. Last seen normal at 8:30 pm last night. Family noticed his speech was slurred and he had right arm weakness. Also, unable to stand on his own. Sleepier than usual. No known seizure activity.  At baseline usually walks unassisted. When tired, uses cane for balance. Has fallen a few times over the past few months. On ASA 325 mg daily.  CT brain negative, but MRI shows 1 cm acute infarction left lateral thalamus.  Question early restricted diffusion in the left posterior temporal cortical brain.  Neurology has consulted in ED  Review of Systems:  Systems reviewed. As above otherwise negative.  Past Medical History  Diagnosis Date  . Arthritis   . Heart murmur   . Hearing loss   . Blood in urine   . 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 Confusion   . Memory loss   . Aphasia   . Easy bruising   . Seizures     last seizure was   . Nocturia   . Hypothyroidism   . Snores   . Atrial fibrillation   . Localization-related (focal) (partial) epilepsy and epileptic syndromes with complex partial seizures, without mention of intractable epilepsy 12/12/2012  . Hypersomnia, unspecified   . Hypothyroidism    Past Surgical History  Procedure Laterality Date  . Prostate surgery  [phone] and 2006  . Joint replacement  1995    right hip replacement   . Inguinal hernia repair  12/28/2010    Procedure: HERNIA REPAIR INGUINAL ADULT;  Surgeon: Iona CoachWilliam J Weatherly, MD;  Location: [facility];  Service: General;  Laterality: Left;  With Mesh  . Cataract extraction Bilateral    Social History:   reports that he has never smoked. He has never used smokeless tobacco. He reports that he does not drink alcohol or use illicit drugs.  Allergies  Allergen Reactions  . Vancomycin Rash    Family History  Problem Relation Age of Onset  . Stroke Mother      Prior to Admission medications   Medication Sig Start Date End Date Taking? Authorizing Provider  cholecalciferol (VITAMIN D) 1000 UNITS tablet Take 1,000 Units by mouth daily.   Yes Historical Provider, MD  levETIRAcetam (KEPPRA) 250 MG tablet TAKE 1 TABLET BY MOUTH TWICE DAILY 11/14/13  Yes York Spanielharles K Willis, MD  levothyroxine (SYNTHROID, LEVOTHROID) 50 MCG tablet Take 50 mcg by mouth every morning.  09/11/10  Yes Historical Provider, MD  phenytoin (DILANTIN) 100 MG ER capsule TAKE 2 CAPSULES BY MOUTH AT BEDTIME 11/14/13  Yes York Spanielharles K Willis, MD  PHENYTOIN INFATABS 50 MG tablet CHEW AND SWALLOW ONE TABLET DAILY 11/14/13  Yes York Spanielharles K Willis, MD   Physical Exam: Filed Vitals:   11/17/13 1038 11/17/13 1123 11/17/13 1227 11/17/13 1452  BP: 161/52 169/58 134/88 121/47  Pulse: 56 57 62 52  Temp: 97.9 F (36.6 C)     TempSrc: Oral     Resp: 12 18 18 18   Height:  5\' 5"  (1.651 m)    Weight:  79.379 kg (175 lb)    SpO2: 100% 99% 99% 98%  Wt Readings from Last 3 Encounters:  11/17/13 79.379 kg (175 lb)  07/02/13 78.926 kg (174 lb)  12/12/12 80.74 kg (178 lb)    BP 121/47  Pulse 52  Temp(Src) 97.9 F (36.6 C) (Oral)  Resp 18  Ht 5\' 5"  (1.651 m)  Wt 79.379 kg (175 lb)  BMI 29.12 kg/m2  SpO2 98%  General Appearance:    Asleep arousable, but stays groggy. Speech somewhat slurred.  Head:    Normocephalic, without obvious abnormality, atraumatic  Eyes:    Right periorbital edema. Right sclera injected. No drainage. EOMI      Nose:   Nares normal, septum midline, mucosa normal, no drainage   or sinus tenderness  Throat:   Dry mucous membranes. No lacerations  Neck:   Supple, symmetrical, trachea midline, no adenopathy;         thyroid:  No enlargement/tenderness/nodules; no carotid   bruit or JVD  Lungs:     Clear to auscultation bilaterally, respirations unlabored  Chest wall:    No tenderness or deformity  Heart:    Irregularly irregular. Slow. No MGR  Abdomen:     Soft, non-tender, bowel sounds active all four quadrants,    no masses, no organomegaly  Genitalia:    deferred  Rectal:    deferred  Extremities:   Extremities normal, atraumatic, no cyanosis or edema  Pulses:   2+ and symmetric all extremities  Skin:   Skin color, texture, turgor normal, no rashes or lesions  Lymph nodes:   Cervical, supraclavicular, and axillary nodes normal  Neurologic:   CNII-XII intact. Right UE strength 5/5. Right LE 4+/5. Left side strength normal             Psych: cooperative    Labs on Admission:  Basic Metabolic Panel:  Recent Labs Lab 11/17/13 1040 11/17/13 1048  NA 139 142  K 4.2 4.1  CL 104 104  CO2 27  --   GLUCOSE 87 89  BUN 22 22  CREATININE 1.12 1.10  CALCIUM 8.4  --    Liver Function Tests:  Recent Labs Lab 11/17/13 1040  AST 16  ALT 12  ALKPHOS 93  BILITOT 0.3  PROT 6.3  ALBUMIN 3.5   No results found for this basename: LIPASE, AMYLASE,  in the last 168 hours No results found for this basename: AMMONIA,  in the last 168 hours CBC:  Recent Labs Lab 11/17/13 1040 11/17/13 1048  WBC 4.3  --   NEUTROABS 2.9  --   HGB 12.3* 12.9*  HCT 37.7* 38.0*  MCV 94.3  --   PLT 151  --    Cardiac Enzymes: No results found for this basename: CKTOTAL, CKMB, CKMBINDEX, TROPONINI,  in the last 168 hours  BNP (last 3 results) No results found for this basename: PROBNP,  in the last 8760 hours CBG:  Recent Labs Lab 11/17/13 1024  GLUCAP 84    Radiological Exams on Admission: Ct Head Wo Contrast  11/17/2013   CLINICAL DATA:  Code stroke.  EXAM: CT HEAD WITHOUT CONTRAST  TECHNIQUE: Contiguous axial images were obtained from the base of the skull through the vertex without  intravenous contrast.  COMPARISON:  09/01/2006.  FINDINGS: Prominence of the sulci and ventricles noted compatible with brain atrophy. There is a large area of encephalomalacia involving the left temporal lobe compatible with history of prior HSV infection. Areas of low attenuation within the subcortical white matter and periventricular white matter noted compatible with chronic small vessel  ischemic disease. There is no evidence for acute intracranial hemorrhage, infarct or mass. The paranasal sinuses are clear.  IMPRESSION: 1. No acute intracranial abnormalities. 2. Left temporal lobe encephalomalacia as before. 3. Small vessel ischemic disease and brain atrophy.   Electronically Signed   By: Signa Kellaylor  Stroud M.D.   On: 11/17/2013 10:44   Mr Brain Wo Contrast  11/17/2013   CLINICAL DATA:  Initial encounter. Confusion. Speech disturbance. Right arm weakness.  EXAM: MRI HEAD WITHOUT CONTRAST  TECHNIQUE: Multiplanar, multiecho pulse sequences of the brain and surrounding structures were obtained without intravenous contrast.  COMPARISON:  Head CT same day.  FINDINGS: The study shows significant motion degradation.  There is a 1 cm acute infarction within the left lateral thalamus. No other acute infarction. One could question early low level restricted diffusion in the left parietal cortical brain inferiorly, but this is not definite.  No focal brainstem insult. No focal cerebellar insult. There is old infarction of the left temporal lobe which has progressed to atrophy and encephalomalacia with adjacent gliosis. There is ordinary moderate chronic small-vessel ischemic change within the cerebral hemispheric deep white matter. No large vessel territory infarction. No mass lesion, hemorrhage, hydrocephalus or extra-axial collection. No pituitary mass. No inflammatory sinus disease. No skull or skullbase lesion.  IMPRESSION: . This may simply be artifactual, but early ischemia in that area is not completely ruled out.   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  Chronic small vessel disease.   Electronically Signed   By: Paulina FusiMark  Shogry M.D.   On: 11/17/2013 14:29    EKG:  Atrial fibrillation with slow rate 52 incomplete RBBB  Assessment/Plan Principal Problem:   Acute ischemic stroke, embolic? Admit to tele, MRA, echo, carotid dopplers neuro checks, lipids. ASA for now. No TPA: outside window. Active Problems:   Chronic atrial fibrillation on ASA. Per wife ususally bradycardic. May need anticoagulation, but need to weigh risk:benefit, as has fallen recently.   Hypothyroidism: check TSH   Seizure disorder continue keppra. Dilantin borderline high, will hold and decrease dose tomorrow   History of herpes encephalitis   Code Status: full Family Communication: wife and daughter at bedside Disposition Plan: inpatient, tele  Time spent: 60 minutes  Ronald Roberts [facility] Pager [phone]

## 2013-11-18 ENCOUNTER — Inpatient Hospital Stay (HOSPITAL_COMMUNITY): Payer: Medicare Other

## 2013-11-18 DIAGNOSIS — I369 Nonrheumatic tricuspid valve disorder, unspecified: Secondary | ICD-10-CM

## 2013-11-18 DIAGNOSIS — Z8619 Personal history of other infectious and parasitic diseases: Secondary | ICD-10-CM

## 2013-11-18 LAB — GLUCOSE, CAPILLARY
GLUCOSE-CAPILLARY: 73 mg/dL (ref 70–99)
Glucose-Capillary: 119 mg/dL — ABNORMAL HIGH (ref 70–99)
Glucose-Capillary: 76 mg/dL (ref 70–99)
Glucose-Capillary: 81 mg/dL (ref 70–99)
Glucose-Capillary: 82 mg/dL (ref 70–99)

## 2013-11-18 LAB — TSH: TSH: 4.59 u[IU]/mL — ABNORMAL HIGH (ref 0.350–4.500)

## 2013-11-18 LAB — LIPID PANEL
Cholesterol: 151 mg/dL (ref 0–200)
HDL: 47 mg/dL (ref >39)
LDL CALC: 86 mg/dL (ref 0–99)
TRIGLYCERIDES: 91 mg/dL (ref <150)
Total CHOL/HDL Ratio: 3.2 RATIO
VLDL: 18 mg/dL (ref 0–40)

## 2013-11-18 LAB — HEMOGLOBIN A1C
Hgb A1c MFr Bld: 5.8 % — ABNORMAL HIGH (ref <5.7)
Mean Plasma Glucose: 120 mg/dL — ABNORMAL HIGH (ref <117)

## 2013-11-18 MED ORDER — HALOPERIDOL LACTATE 5 MG/ML IJ SOLN
2.5000 mg | Freq: Once | INTRAMUSCULAR | Status: AC
  Administered 2013-11-18: 2.5 mg via INTRAVENOUS
  Filled 2013-11-18: qty 1

## 2013-11-18 MED ORDER — LORAZEPAM 2 MG/ML IJ SOLN
1.0000 mg | Freq: Once | INTRAMUSCULAR | Status: AC
  Administered 2013-11-18: 1 mg via INTRAVENOUS
  Filled 2013-11-18: qty 1

## 2013-11-18 MED ORDER — ATORVASTATIN CALCIUM 10 MG PO TABS
20.0000 mg | ORAL_TABLET | Freq: Every day | ORAL | Status: DC
  Administered 2013-11-18 – 2013-11-19 (×2): 20 mg via ORAL
  Filled 2013-11-18 (×2): qty 2

## 2013-11-18 MED ORDER — PHENYTOIN SODIUM EXTENDED 100 MG PO CAPS
200.0000 mg | ORAL_CAPSULE | Freq: Every day | ORAL | Status: DC
  Administered 2013-11-18 – 2013-11-19 (×2): 200 mg via ORAL
  Filled 2013-11-18 (×2): qty 2

## 2013-11-18 MED ORDER — LEVOTHYROXINE SODIUM 50 MCG PO TABS
50.0000 ug | ORAL_TABLET | Freq: Every day | ORAL | Status: DC
  Administered 2013-11-19 – 2013-11-20 (×2): 50 ug via ORAL
  Filled 2013-11-18 (×2): qty 1

## 2013-11-18 NOTE — Progress Notes (Signed)
STROKE TEAM PROGRESS NOTE   HISTORY Ronald Roberts is a 80 y.o. 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 His wife helped him to get up to go to the bathroom around 3 AM 10/19/2015and at that time, he was started experiencing symptoms but she was able to give him there. Following this, he went back to bed and she remained awake. When she went back to check on him later, she was unable to get him up and therefore he was brought via EMS. At baseline, he has non-dysarthric speech but his words are often nonsensical, though he can at times have simple conversations. He does have a history of seizures, however he has not had right-sided weakness following previous seizures. Seizures are partial involving his right arm and leg. He was last known well at 8:30 PM on 11/17/2013. Patient was not administered TPA secondary to delay in arrival. He was admitted for further evaluation and treatment.   SUBJECTIVE (INTERVAL HISTORY) His sitter is at the bedside, his wife arrived during rounds. Dr. Pearlean Brownie sat and spoke with wife related to events that led to hospitalization.   OBJECTIVE Temp:  [97 F (36.1 C)-98.6 F (37 C)] 97.8 F (36.6 C) (10/19 0954) Pulse Rate:  [52-66] 66 (10/19 0954) Cardiac Rhythm:  [-] Atrial fibrillation (10/18 1930) Resp:  [16-18] 18 (10/19 0954) BP: (117-158)/(40-96) 130/66 mmHg (10/19 0954) SpO2:  [97 %-100 %] 99 % (10/19 0954)   Recent Labs Lab 11/17/13 1024 11/18/13 0011 11/18/13 0438 11/18/13 1024  GLUCAP 84 81 76 73    Recent Labs Lab 11/17/13 1040 11/17/13 1048  NA 139 142  K 4.2 4.1  CL 104 104  CO2 27  --   GLUCOSE 87 89  BUN 22 22  CREATININE 1.12 1.10  CALCIUM 8.4  --     Recent Labs Lab 11/17/13 1040  AST 16  ALT 12  ALKPHOS 93  BILITOT 0.3  PROT 6.3  ALBUMIN 3.5    Recent Labs Lab 11/17/13 1040 11/17/13 1048  WBC  4.3  --   NEUTROABS 2.9  --   HGB 12.3* 12.9*  HCT 37.7* 38.0*  MCV 94.3  --   PLT 151  --    No results found for this basename: CKTOTAL, CKMB, CKMBINDEX, TROPONINI,  in the last 168 hours  Recent Labs  11/17/13 1040  LABPROT 15.9*  INR 1.26    Recent Labs  11/17/13 1223  COLORURINE YELLOW  LABSPEC 1.016  PHURINE 7.5  GLUCOSEU NEGATIVE  HGBUR SMALL*  BILIRUBINUR NEGATIVE  KETONESUR NEGATIVE  PROTEINUR NEGATIVE  UROBILINOGEN 0.2  NITRITE NEGATIVE  LEUKOCYTESUR NEGATIVE       Component Value Date/Time   CHOL 151 11/18/2013 0450   TRIG 91 11/18/2013 0450   HDL 47 11/18/2013 0450   CHOLHDL 3.2 11/18/2013 0450   VLDL 18 11/18/2013 0450   LDLCALC 86 11/18/2013 0450   No results found for this basename: HGBA1C      Component Value Date/Time   LABOPIA NONE DETECTED 11/17/2013 1223   COCAINSCRNUR NONE DETECTED 11/17/2013 1223   LABBENZ NONE DETECTED 11/17/2013 1223   AMPHETMU NONE DETECTED 11/17/2013 1223   THCU NONE DETECTED 11/17/2013 1223   LABBARB NONE DETECTED 11/17/2013 1223     Recent Labs Lab 11/17/13 1040  ETH <11    Ct Head Wo Contrast  11/17/2013   CLINICAL DATA:  Code stroke.  EXAM: CT HEAD  WITHOUT CONTRAST  TECHNIQUE: Contiguous axial images were obtained from the base of the skull through the vertex without intravenous contrast.  COMPARISON:  09/01/2006.  FINDINGS: Prominence of the sulci and ventricles noted compatible with brain atrophy. There is a large area of encephalomalacia involving the left temporal lobe compatible with history of prior HSV infection. Areas of low attenuation within the subcortical white matter and periventricular white matter noted compatible with chronic small vessel ischemic disease. There is no evidence for acute intracranial hemorrhage, infarct or mass. The paranasal sinuses are clear.  IMPRESSION: 1. No acute intracranial abnormalities. 2. Left temporal lobe encephalomalacia as before. 3. Small vessel ischemic disease  and brain atrophy.   Electronically Signed   By: Signa Kellaylor  Stroud M.D.   On: 11/17/2013 10:44   Mr Brain Wo Contrast  11/17/2013   CLINICAL DATA:  Initial encounter. Confusion. Speech disturbance. Right arm weakness.  EXAM: MRI HEAD WITHOUT CONTRAST  TECHNIQUE: Multiplanar, multiecho pulse sequences of the brain and surrounding structures were obtained without intravenous contrast.  COMPARISON:  Head CT same day.  FINDINGS: The study shows significant motion degradation.  There is a 1 cm acute infarction within the left lateral thalamus. No other acute infarction. One could question early low level restricted diffusion in the left parietal cortical brain inferiorly, but this is not definite.  No focal brainstem insult. No focal cerebellar insult. There is old infarction of the left temporal lobe which has progressed to atrophy and encephalomalacia with adjacent gliosis. There is ordinary moderate chronic small-vessel ischemic change within the cerebral hemispheric deep white matter. No large vessel territory infarction. No mass lesion, hemorrhage, hydrocephalus or extra-axial collection. No pituitary mass. No inflammatory sinus disease. No skull or skullbase lesion.  IMPRESSION: 1 cm acute infarction left lateral thalamus.  Question early restricted diffusion in the left posterior temporal cortical brain. This may simply be artifactual, but early ischemia in that area is not completely ruled out.  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  Chronic small vessel disease.   Electronically Signed   By: Paulina FusiMark  Shogry M.D.   On: 11/17/2013 14:29   Mr Maxine GlennMra Head/brain Wo Cm  11/18/2013   CLINICAL DATA:  Right arm weakness. Acute infarct left thalamus on MRI.  EXAM: MRA HEAD WITHOUT CONTRAST  TECHNIQUE: Angiographic images of the Circle of Willis were obtained using MRA technique without intravenous contrast.  COMPARISON:  Brain MRI 11/17/2013  FINDINGS: Images are moderately degraded by motion artifact. Visualized distal vertebral  arteries are patent with the right being mildly dominant. PICA and AICA is origins are not well evaluated. Basilar artery is patent without definite stenosis, although its midportion is not well evaluated due to artifact. SCA origins are patent. Right PCA is unremarkable. There is occlusion of the left PCA new the T1-T2 junction. There is a patent right posterior communicating artery. A patent left posterior communicating artery is not clearly identified. There is an approximately 3 mm conical outpouching from the supraclinoid left ICA in the expected region of the posterior communicating artery origin.  Visualized distal right cervical ICA is tortuous. Internal carotid arteries are patent from skullbase to carotid termini without evidence of significant stenosis. A1 and M1 segments are patent bilaterally without evidence of stenosis. Mild bilateral ACA and MCA branch vessel irregularity is present.  IMPRESSION: 1. Moderately motion degraded examination. 2. Occlusion of the proximal left PCA. 3. Major anterior circulation vessels patent without evidence of significant proximal stenosis. 4. 3 mm aneurysm versus prominent infundibulum in the left  posterior communicating artery origin region.   Electronically Signed   By: Sebastian AcheAllen  Grady   On: 11/18/2013 11:12   2D Echocardiogram  EF 55-60% with no source of embolus.   Carotid Doppler  No evidence of hemodynamically significant internal carotid artery stenosis. Vertebral artery flow is antegrade.   PHYSICAL EXAM Elderly Caucasian male not in distress.Awake alert. Afebrile. Head is nontraumatic. Neck is supple without bruit. Hearing is normal. Cardiac exam no murmur or gallop. Lungs are clear to auscultation. Distal pulses are well felt. Neurological Exam : Awake alert disoriented, speech tangential with flight of ideas. Poor insight, registration and recall.follows only 1 step commands. Face symmetric. tongue midline. Eye movements full. Fundi not visualized.  Blinks to threat bilaterally.No UE drift but diminished fine finger movements on right and rightt grip weakness. Moves Le well bilaterally.Gait deferred. Sensation preserved. ASSESSMENT/PLAN Mr. Ronald Roberts is a 80 y.o. male with history of aphasia and memory loss following herpes encephalitis in 2006 presenting with right-sided weakness. He did not receive IV t-PA due to delay in arrival.   Stroke:  Dominant left lateral thalamic infarct secondary to occlusion of the L PCA  MRI  L thalamic infarct  MRA  L PCA occlusion. 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   Carotid Doppler  No significant stenosis   2D Echo  No source of embolus   HgbA1c pending  Lovenox 40 mg sq daily for VTE prophylaxis  NPO  Bedrest, OOB with assistance  no antithrombotics prior to admission, now on aspirin 300 mg suppository  Ongoing aggressive risk factor management  Therapy recommendations:  SNF  Disposition:  SNF  left posterior communicating artery origin aneurysm   Incidental   Small, 3 mm   No treatment indicated  Seizures - Localization-related (focal) (partial) epilepsy and epileptic syndromes with complex partial seizures, without mention of intractable epilepsy   Hx simple partial seizures involving his right arm and leg  No hx of right sided weakness post seizure  On Keppra 250 mg twice a day and Dilantin 200 mg ER   Atrial Fibrillation  Home meds:  No anticoagulation  Consider anticoagulation - not on prior to admission due to fall risk   Hyperlipidemia  Home meds:  No statin  LDL 86, goal < 70  Consider adding low dose statin  Other Stroke Risk Factors Advanced age   Obesity, Body mass index is 29.12 kg/(m^2).    Family hx stroke (mother)  Hospital day # 1  Annie MainSHARON BIBY, MSN, RN, ANVP-BC, ANP-BC, Lawernce IonGNP-BC [facility] Pager: [phone] 11/18/2013 3:53 PM   I have personally examined this patient, reviewed notes,  independently viewed imaging studies, participated in medical decision making and plan of care. I have made any additions or clarifications directly to the above note. Agree with note above.   Delia HeadyPramod Sethi, MD Medical Director [facility] Pager: [phone] 11/19/2013 12:07 PM   To contact Stroke Continuity provider, please refer to [email or web address]. After hours, contact General Neurology

## 2013-11-18 NOTE — Evaluation (Signed)
Physical Therapy Evaluation Patient Details Name: Ronald SkyeWilliam S Masci MRN: [id number] DOB: 1922/04/26 Today's Date: 11/18/2013   History of Present Illness  80 [facility]. [facility] with slurred speech and right sided weakness. MRI revealed, acute infarction within the left lateral thalamus.  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    Clinical Impression  Pt is requiring min to mod assist for stand pivot transfer with RW from bed to recliner and demonstrates weakness and decreased sensation in his right leg.  He has a very supportive, but elderly wife who cannot handle him physically in his current state.  She would like to seek SNF level rehab after I discussed with her the post-acute therapy options.  [facility] is at the top of her list right now.  CSW contacted.   PT to follow acutely for deficits listed below.       Follow Up Recommendations SNF ([facility])    Engineer, agriculturalquipment Recommendations  Rolling walker with 5" wheels    Recommendations for Other Services   NA    Precautions / Restrictions Precautions Precautions: Fall Precaution Comments: due to cognition and R sided weakness      Mobility  Bed Mobility Overal bed mobility: Needs Assistance Bed Mobility: Supine to Sit     Supine to sit: Min assist     General bed mobility comments: Min assist to support trunk during transition to sitting.  Pt able to with some effort get bil legs over EOB.   Transfers Overall transfer level: Needs assistance Equipment used: Rolling walker (2 wheeled) Transfers: Sit to/from [employer] CorporationStand;Stand Pivot Transfers Sit to Stand: Min assist Stand pivot transfers: Mod assist       General transfer comment: Min assist to stand from bed with verbal and tactile/manual cues for safe hand placement.  Therapist blocking right knee initially, but pt was  able to put weight through it with minimal buckling.  Stand pivot with RW to his right and assist needed to balance trunk, help steer R and provide some light stability with my hand at his right knee when stepping with the left foot.    Ambulation/Gait             General Gait Details: Pre gait initiated EOB stepping forward and backward with R knee blocked and then again with RW 2 steps forward 2 back several times in anticipation of gait next session.    Modified Rankin (Stroke Patients Only) Modified Rankin (Stroke Patients Only) Pre-Morbid Rankin Score: No significant disability Modified Rankin: Moderately severe disability     Balance Overall balance assessment: Needs assistance Sitting-balance support: Feet supported;No upper extremity supported Sitting balance-Leahy Scale: Good     Standing balance support: Bilateral upper extremity supported Standing balance-Leahy Scale: Poor Standing balance comment: Pt needs upper extremity support and external support to stand safely.                              Pertinent Vitals/Pain Pain Assessment: No/denies pain    Home Living Family/patient expects to be discharged to:: Private residence Living Arrangements: Spouse/significant other 32(82 y.o. independent) Available Help at Discharge: Family;Available 24 hours/day Type of Home: House Home Access: Stairs to enter Entrance Stairs-Rails: Right Entrance Stairs-Number of Steps: 2 Home Layout: One level Home Equipment: Cane - single point  Prior Function Level of Independence: Needs assistance   Gait / Transfers Assistance Needed: pt ambulates independently, uses a cane when outside or when tired.            Hand Dominance   Dominant Hand: Right    Extremity/Trunk Assessment   Upper Extremity Assessment: Defer to OT evaluation           Lower Extremity Assessment: RLE deficits/detail RLE Deficits / Details: right leg with decreased strength at  3+/5 level throughout per bed level MMT.  Some indications that there is also decreased sensation here as well.     Cervical / Trunk Assessment: Normal  Communication   Communication: HOH;Expressive difficulties  Cognition Arousal/Alertness: Awake/alert Behavior During Therapy: WFL for tasks assessed/performed Overall Cognitive Status: History of cognitive impairments - at baseline                               Assessment/Plan    PT Assessment Patient needs continued PT services  PT Diagnosis Difficulty walking;Abnormality of gait;Generalized weakness;Hemiplegia dominant side   PT Problem List Decreased strength;Decreased balance;Decreased activity tolerance;Decreased mobility;Decreased cognition;Decreased knowledge of use of DME;Decreased safety awareness;Impaired sensation  PT Treatment Interventions DME instruction;Gait training;Functional mobility training;Therapeutic activities;Therapeutic exercise;Neuromuscular re-education;Balance training;Cognitive remediation;Patient/family education   PT Goals (Current goals can be found in the Care Plan section) Acute Rehab PT Goals Patient Stated Goal: wife would like SNF level rehab with hopes to return to being ambulatory PT Goal Formulation: With family Time For Goal Achievement: 12/02/13 Potential to Achieve Goals: Good    Frequency Min 3X/week   Barriers to discharge Decreased caregiver support wife is elderly and cannot physically handle him in his current state       End of Session Equipment Utilized During Treatment: Gait belt Activity Tolerance: Patient tolerated treatment well Patient left: in chair;with call bell/phone within reach;with chair alarm set;with family/visitor present;with nursing/sitter in room Psychiatrist(sitter and wife in room) Nurse Communication: Mobility status (to RN tech)         Time: [id number] PT Time Calculation (min): 31 min   Charges:   PT Evaluation $Initial PT Evaluation Tier I: 1  Procedure PT Treatments $Therapeutic Activity: 8-22 mins        Markela Wee B. Caspian Deleonardis, PT, DPT [phone]   11/18/2013, 2:29 PM

## 2013-11-18 NOTE — Progress Notes (Signed)
Echo Lab  2D Echocardiogram completed.  Karyssa Amaral L Curlie Sittner, RDCS 11/18/2013 8:49 AM

## 2013-11-18 NOTE — Clinical Social Work Placement (Addendum)
Clinical Social Work Department CLINICAL SOCIAL WORK PLACEMENT NOTE 11/18/2013  Patient:  Ronald SkyeWARD,[name]  Account Number:  [id number] Admit date:  11/17/2013  Clinical Social Worker:  Gwynne EdingerYSHEKA Kemani Heidel, LCSWA  Date/time:  11/18/2013 03:39 PM  Clinical Social Work is seeking post-discharge placement for this patient at the following level of care:   SKILLED NURSING   (*CSW will update this form in Epic as items are completed)   11/18/2013  Patient/family provided with [facility] Department of Clinical Social Work's list of facilities offering this level of care within the geographic area requested by the patient (or if unable, by the patient's family).  11/18/2013  Patient/family informed of their freedom to choose among providers that offer the needed level of care, that participate in Medicare, Medicaid or managed care program needed by the patient, have an available bed and are willing to accept the patient.  11/18/2013  Patient/family informed of [facility]' ownership interest in [facility], as well as of the fact that they are under no obligation to receive care at this facility.  PASARR submitted to EDS on 11/18/2013 PASARR number received on 11/18/2013  FL2 transmitted to all facilities in geographic area requested by pt/family on  11/18/2013 FL2 transmitted to all facilities within larger geographic area on 11/18/2013  Patient informed that his/her managed care company has contracts with or will negotiate with  certain facilities, including the following:     Patient/family informed of bed offers received:  11/19/2013 [facility]   Physician recommends and patient chooses bed at    [facility]  on 11/[facility]/2013  Patient to be transferred to facility by [facility] Patient and family notified of transfer on 11/20/2013 Name of family member notified:  [name] and [name] (wife and daughter)   The following physician request  were entered in Epic:   Additional Comments:  Kashara Blocher, MSW, [place] [phone]

## 2013-11-18 NOTE — Progress Notes (Signed)
VASCULAR LAB PRELIMINARY  PRELIMINARY  PRELIMINARY  PRELIMINARY  Carotid Dopplers completed.    Preliminary report:  There is 1-39% ICA stenosis.  Vertebral artery flow is antegrade.   Antony Sian, RVT 11/18/2013, 8:41 AM

## 2013-11-18 NOTE — Clinical Social Work Psychosocial (Signed)
Clinical Social Work Department BRIEF PSYCHOSOCIAL ASSESSMENT 11/18/2013  Patient:  Ronald Roberts, Ronald Roberts     Account Number:  [id number]     Admit date:  11/17/2013  Clinical Social Worker:  Marciano Sequin  Date/Time:  11/18/2013 03:29 PM  Referred by:  RN  Date Referred:  11/18/2013 Referred for  SNF Placement   Other Referral:   Interview type:  Family Other interview type:   Pt is not oriented; wife Ronald Roberts [phone]    PSYCHOSOCIAL DATA Living Status:  WIFE Admitted from facility:   Level of care:   Primary support name:  Ronald Roberts Primary support relationship to patient:  SPOUSE Degree of support available:   Strong    CURRENT CONCERNS  Other Concerns:    SOCIAL WORK ASSESSMENT / PLAN CSW met pt and family (wife, son, and daughter) at bedside. CSW introduce self and purpose of visit. CSW and pt's wife Ronald Roberts, son and daughter discussed the clinical team recommendations for rehab. Ronald Roberts expressed interest in [place]. Ronald Roberts reported wanting her husband to receive rehab, so he can transition back home. Ronald Roberts reported that she does not wish to place the pt in long-term care. CSW explained the SNF process to the family. CSW provided the family with contact information for further questions. CSW will continue to follow this pt and assist with discharge as needed.   Assessment/plan status:  Information/Referral to [employer] Other assessment/ plan:   Information/referral to community resources:   SNF List    PATIENT'S/FAMILY'S RESPONSE TO PLAN OF CARE: Pt's family was very pleasent and nice. Pt's family agrees with plan.   [place], MSW, [place]

## 2013-11-18 NOTE — Progress Notes (Signed)
[facility] PROGRESS NOTE  Ronald Roberts ZOX:[id number] DOB: [birth year] DOA: 11/17/2013 PCP: Lorenda Peck, MD  Assessment/Plan: 1. Acute CVA -Patient presenting with complaints of dysarthria and right-sided weakness, MRI of the brain showing a 1 cm acute infarction within the left lateral thalamus. -Continue aspirin 325 mg by mouth daily. Patient having history of atrial fibrillation and had not been on anticoagulation. There had been concern for falls.  -Will add Lipitor 20 mg by mouth daily, LDL at 86 -Systolic blood pressures in the 130s to 150s -Transthoracic echocardiogram showing ejection fraction of 60-65% -Speech pathology recommending regular diet with thin liquid. -Physical therapy recommending SNF placement -Will consider anticoagulation prior to discharge  2.  Dyslipidemia -Fasting panel showing LDL of 86 -Will add Lipitor 20 mg by mouth daily  3.  History of atrial fibrillation -Currently rate controlled. Patient had not been on anticoagulation due to recurrent falls -It is possible acute CVA may have resulted from thromboembolic phenomenon -Will consider discharging him on anticoagulation  4. Seizure disorder -Continue Keppra 250 mg twice a day and Dilantin 200 mg PO q daily  5.  Hypothyroidism -Will continue Synthroid 50 mcg by mouth daily  Code Status: Full code Family Communication: I spoke to his wife present at bedside Disposition Plan: Anticipate discharge to SNF when stable   Consultants:  Neurology  Procedures:  Transthoracic echocardiogram performed on 11/18/2013 impression: LVEF 60-65%   HPI/Subjective: Patient is a pleasant 80 year old gentleman with a past medical history of herpes encephalitis, who was admitted to the medicine service on 11/17/2013 presenting with complaints of right-sided weakness and dysarthria. He was initially worked up with CT scan of brain which showed no acute intracranial abnormalities. Left temporal  lobe encephalomalacia was noted. He was treated with aspirin 325 mg by mouth daily. Neurology consulted, patient placed on CVA protocol.   Objective: Filed Vitals:   11/18/13 1505  BP: 151/54  Pulse: 68  Temp: 98.2 F (36.8 C)  Resp: 17    Intake/Output Summary (Last 24 hours) at 11/18/13 1631 Last data filed at 11/18/13 1208  Gross per 24 hour  Intake      0 ml  Output    300 ml  Net   -300 ml   Filed Weights   11/17/13 1123  Weight: 79.379 kg (175 lb)    Exam:   General:  Patient is in no acute distress awake alert oriented  Cardiovascular: Regular rate rhythm normal S1-S2 no murmurs or gallop  Respiratory: Clear to auscultation bilaterally no wheezing rhonchi while  Abdomen: Soft nontender nondistended  Musculoskeletal: No edema  Neurological: He is dysarthric, 4-5 muscle strength to right lower extremity, patient is awake alert and can follow commands  Data Reviewed: Basic Metabolic Panel:  Recent Labs Lab 11/17/13 1040 11/17/13 1048  NA 139 142  K 4.2 4.1  CL 104 104  CO2 27  --   GLUCOSE 87 89  BUN 22 22  CREATININE 1.12 1.10  CALCIUM 8.4  --    Liver Function Tests:  Recent Labs Lab 11/17/13 1040  AST 16  ALT 12  ALKPHOS 93  BILITOT 0.3  PROT 6.3  ALBUMIN 3.5   No results found for this basename: LIPASE, AMYLASE,  in the last 168 hours No results found for this basename: AMMONIA,  in the last 168 hours CBC:  Recent Labs Lab 11/17/13 1040 11/17/13 1048  WBC 4.3  --   NEUTROABS 2.9  --   HGB 12.3* 12.9*  HCT  37.7* 38.0*  MCV 94.3  --   PLT 151  --    Cardiac Enzymes: No results found for this basename: CKTOTAL, CKMB, CKMBINDEX, TROPONINI,  in the last 168 hours BNP (last 3 results) No results found for this basename: PROBNP,  in the last 8760 hours CBG:  Recent Labs Lab 11/17/13 1024 11/18/13 0011 11/18/13 0438 11/18/13 1024 11/18/13 1150  GLUCAP 84 81 76 73 82    No results found for this or any previous visit  (from the past 240 hour(s)).   Studies: Ct Head Wo Contrast  11/17/2013   CLINICAL DATA:  Code stroke.  EXAM: CT HEAD WITHOUT CONTRAST  TECHNIQUE: Contiguous axial images were obtained from the base of the skull through the vertex without intravenous contrast.  COMPARISON:  09/01/2006.  FINDINGS: Prominence of the sulci and ventricles noted compatible with brain atrophy. There is a large area of encephalomalacia involving the left temporal lobe compatible with history of prior HSV infection. Areas of low attenuation within the subcortical white matter and periventricular white matter noted compatible with chronic small vessel ischemic disease. There is no evidence for acute intracranial hemorrhage, infarct or mass. The paranasal sinuses are clear.  IMPRESSION: 1. No acute intracranial abnormalities. 2. Left temporal lobe encephalomalacia as before. 3. Small vessel ischemic disease and brain atrophy.   Electronically Signed   By: Signa Kellaylor  Stroud M.D.   On: 11/17/2013 10:44   Mr Brain Wo Contrast  11/17/2013   CLINICAL DATA:  Initial encounter. Confusion. Speech disturbance. Right arm weakness.  EXAM: MRI HEAD WITHOUT CONTRAST  TECHNIQUE: Multiplanar, multiecho pulse sequences of the brain and surrounding structures were obtained without intravenous contrast.  COMPARISON:  Head CT same day.  FINDINGS: The study shows significant motion degradation.  There is a 1 cm acute infarction within the left lateral thalamus. No other acute infarction. One could question early low level restricted diffusion in the left parietal cortical brain inferiorly, but this is not definite.  No focal brainstem insult. No focal cerebellar insult. There is old infarction of the left temporal lobe which has progressed to atrophy and encephalomalacia with adjacent gliosis. There is ordinary moderate chronic small-vessel ischemic change within the cerebral hemispheric deep white matter. No large vessel territory infarction. No mass  lesion, hemorrhage, hydrocephalus or extra-axial collection. No pituitary mass. No inflammatory sinus disease. No skull or skullbase lesion.  IMPRESSION: 1 cm acute infarction left lateral thalamus.  Question early restricted diffusion in the left posterior temporal cortical brain. This may simply be artifactual, but early ischemia in that area is not completely ruled out.  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  Chronic small vessel disease.   Electronically Signed   By: Paulina FusiMark  Shogry M.D.   On: 11/17/2013 14:29   Mr Maxine GlennMra Head/brain Wo Cm  11/18/2013   CLINICAL DATA:  Right arm weakness. Acute infarct left thalamus on MRI.  EXAM: MRA HEAD WITHOUT CONTRAST  TECHNIQUE: Angiographic images of the Circle of Willis were obtained using MRA technique without intravenous contrast.  COMPARISON:  Brain MRI 11/17/2013  FINDINGS: Images are moderately degraded by motion artifact. Visualized distal vertebral arteries are patent with the right being mildly dominant. PICA and AICA is origins are not well evaluated. Basilar artery is patent without definite stenosis, although its midportion is not well evaluated due to artifact. SCA origins are patent. Right PCA is unremarkable. There is occlusion of the left PCA new the T1-T2 junction. There is a patent right posterior communicating artery. A patent left  posterior communicating artery is not clearly identified. There is an approximately 3 mm conical outpouching from the supraclinoid left ICA in the expected region of the posterior communicating artery origin.  Visualized distal right cervical ICA is tortuous. Internal carotid arteries are patent from skullbase to carotid termini without evidence of significant stenosis. A1 and M1 segments are patent bilaterally without evidence of stenosis. Mild bilateral ACA and MCA branch vessel irregularity is present.  IMPRESSION: 1. Moderately motion degraded examination. 2. Occlusion of the proximal left PCA. 3. Major anterior circulation vessels  patent without evidence of significant proximal stenosis. 4. 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   Electronically Signed   By: Sebastian AcheAllen  Grady   On: 11/18/2013 11:12    Scheduled Meds: . aspirin  300 mg Rectal Daily   Or  . aspirin  325 mg Oral Daily  . enoxaparin (LOVENOX) injection  40 mg Subcutaneous Q24H  . levETIRAcetam  250 mg Intravenous BID  . levothyroxine  25 mcg Intravenous Daily  . phenytoin (DILANTIN) IV  100 mg Intravenous QHS   Continuous Infusions: . sodium chloride 125 mL/hr at 11/18/13 1437    Principal Problem:   Acute ischemic stroke Active Problems:   Chronic atrial fibrillation   Hypothyroidism   Seizure disorder   History of herpes encephalitis    Time spent: 35 min    Jeralyn BennettZAMORA, Malli Falotico  [facility] Pager [phone]. If 7PM-7AM, please contact night-coverage at [email or web address], password [email or web address] 11/18/2013, 4:31 PM  LOS: 1 day

## 2013-11-18 NOTE — Progress Notes (Signed)
Order for bedside sitter. Nursing staff with patient. Will continue to monitor.

## 2013-11-18 NOTE — Progress Notes (Signed)
Patient awake continues to be confused and attempting to get out of bed unassisted. Moved to camera room earlier in the shift as patient was exhibiting same behavior. Continue reorientation to situation and place. M.d Notified.

## 2013-11-18 NOTE — Progress Notes (Signed)
UR complete.  Mishti Swanton RN, MSN 

## 2013-11-18 NOTE — Evaluation (Signed)
Clinical/Bedside Swallow Evaluation Patient Details  Name: Ronald Roberts MRN: [id number] Date of Birth: [birth year]  Today's Date: 11/18/2013 Time: [id number] SLP Time Calculation (min): 22 min  Past Medical History:  Past Medical History  Diagnosis Date  . Arthritis   . Heart murmur   . Hearing loss   . Blood in urine   . 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 Confusion   . Memory loss   . Aphasia   . Easy bruising   . Seizures     last seizure was   . Nocturia   . Hypothyroidism   . Snores   . Atrial fibrillation   . Localization-related (focal) (partial) epilepsy and epileptic syndromes with complex partial seizures, without mention of intractable epilepsy 12/12/2012  . Hypersomnia, unspecified   . Hypothyroidism    Past Surgical History:  Past Surgical History  Procedure Laterality Date  . Prostate surgery  [phone] and 2006  . Joint replacement  1995    right hip replacement   . Inguinal hernia repair  12/28/2010    Procedure: HERNIA REPAIR INGUINAL ADULT;  Surgeon: Iona CoachWilliam J Weatherly, MD;  Location: [facility];  Service: General;  Laterality: Left;  With Mesh  . Cataract extraction Bilateral    HPI:  Ronald Roberts is a 80 y.o. male with h/o herpes encephalitis with resultant seizure disorder, memory impairement and expressive aphasia, chronic atrial fibrillation and hypothyroidism, and recently diagnosed mild dementia, who presents with slurred speech and right arm weakness, unable to stand on his own, and sleepier than usual. MRI shows 1 cm acute infarction left lateral thalamus. Question early restricted diffusion in the left posterior temporal cortical brain.    Assessment / Plan / Recommendation Clinical Impression  Patient presents with a functional appearing oropharyngeal swallow with suspected delayed swallow initiation (? chronic mild dysphagia vs acute from CVA vs both) but without overt s/s of aspiration, functional oral transit  of bolus, and clear vocal quality throughout po trials. Given h/o dysphagia and acute CVA, will f/u briefly for tolerance. Education complete with family for use of general safe swallowing precautions including no straws to decrease bolus size. SLP will f/u.     Aspiration Risk  Moderate    Diet Recommendation Regular;Thin liquid   Liquid Administration via: Cup;No straw Medication Administration: Whole meds with liquid Supervision: Patient able to self feed;Full supervision/cueing for compensatory strategies Compensations: Slow rate;Small sips/bites Postural Changes and/or Swallow Maneuvers: Seated upright 90 degrees    Other  Recommendations Oral Care Recommendations: Oral care BID   Follow Up Recommendations  Inpatient Rehab    Frequency and Duration min 2x/week  1 week   Pertinent Vitals/Pain n/a     Swallow Study Prior Functional Status  Type of Home: House Available Help at Discharge: Family;Available 24 hours/day    General HPI: Ronald Roberts is a 80 y.o. male with h/o herpes encephalitis with resultant seizure disorder, memory impairement and expressive aphasia, chronic atrial fibrillation and hypothyroidism, and recently diagnosed mild dementia, who presents with slurred speech and right arm weakness, unable to stand on his own, and sleepier than usual. MRI shows 1 cm acute infarction left lateral thalamus. Question early restricted diffusion in the left posterior temporal cortical brain.  Type of Study: Bedside swallow evaluation Previous Swallow Assessment: per family, dysphagia s/p trach s/p encephalitis. Was presviously on thickened liquids (10 years ago), advanced following a few weeks Diet Prior to this Study: NPO  Temperature Spikes Noted: No Respiratory Status: Room air History of Recent Intubation: No Behavior/Cognition: Alert;Cooperative;Pleasant mood (expressive aphasia, mild apraxia vs confusion, TBD) Oral Cavity - Dentition: Adequate natural  dentition Self-Feeding Abilities: Able to feed self Patient Positioning: Upright in bed Baseline Vocal Quality: Clear Volitional Cough: Strong Volitional Swallow: Able to elicit    Oral/Motor/Sensory Function Overall Oral Motor/Sensory Function: Appears within functional limits for tasks assessed   Ice Chips Ice chips: Not tested   Thin Liquid Thin Liquid: Within functional limits (other than suspected delayed swallow initiation)    Nectar Thick Nectar Thick Liquid: Not tested   Honey Thick Honey Thick Liquid: Not tested   Puree Puree: Within functional limits Presentation: Self Fed;Spoon   Solid   GO    Solid: Within functional limits Presentation: Self Fed      Ronald Frankland MA, CCC-SLP [phone]  Ronald Roberts Meryl 11/18/2013,4:25 PM

## 2013-11-18 NOTE — Evaluation (Addendum)
Occupational Therapy Evaluation Patient Details Name: Ronald SkyeWilliam S Mikula MRN: [id number] DOB: 1922/07/15 Today's Date: 11/18/2013    History of Present Illness 80 [facility].o. [facility] with slurred speech and right sided weakness. MRI revealed, acute infarction within the left lateral thalamus.  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     Clinical Impression   Pt s/p above. Pt independent with ADLs, PTA. Pt attempting to get OOB when OT arrived-pt agitated and not following commands well. Recommending SNF for rehab.     Follow Up Recommendations  SNF;Supervision/Assistance - 24 hour    Equipment Recommendations  Other (comment) (defer to next venue)    Recommendations for Other Services       Precautions / Restrictions Precautions Precautions: Fall Precaution Comments: due to cognition and R sided weakness      Mobility Bed Mobility Overal bed mobility: Needs Assistance Bed Mobility: Supine to Sit     Supine to sit: Min assist     General bed mobility comments: pt trying to get OOB when OT arrived. Assist to get leg out between bed rail.   Transfers Overall transfer level: Needs assistance Equipment used: Rolling walker (2 wheeled) Transfers: Sit to/from Stand Sit to Stand: Max assist;Mod assist        General transfer comment: Max A to make pt sit in chair to prevent fall.  Pt not following commands very well.         ADL Overall ADL's : Needs assistance/impaired     Grooming: Wash/dry face;Set up;Supervision/safety;Sitting           Upper Body Dressing : Minimal assistance;Sitting   Lower Body Dressing: Sit to/from stand;Moderate assistance/Maximal assistance   Toilet Transfer: Moderate assistance;Ambulation;RW (took a few steps)           Functional mobility during ADLs: Moderate assistance;Rolling walker General ADL  Comments: Told family to have pt continue trying to use RUE for activities. Pt using it to donn/doff socks however coordination appeared to be impaired. Pt agitated during session and not following commands well.      Vision                     Perception     Praxis      Pertinent Vitals/Pain Pain Assessment: No/denies pain     Hand Dominance Right   Extremity/Trunk Assessment Upper Extremity Assessment Upper Extremity Assessment: RUE deficits/detail RUE Deficits / Details: appears weaker than LUE-pt agitated during session so did not perform MMT RUE Coordination: decreased fine motor   Lower Extremity Assessment Lower Extremity Assessment: Defer to PT evaluation RLE Deficits / Details: right leg with decreased strength at 3+/5 level throughout per bed level MMT.  Some indications that there is also decreased sensation here as well.  RLE Sensation: decreased light touch   Cervical / Trunk Assessment Cervical / Trunk Assessment: Normal   Communication Communication Communication: HOH;Expressive difficulties   Cognition Arousal/Alertness: Awake/alert Behavior During Therapy: WFL for tasks assessed/performed Overall Cognitive Status: History of cognitive impairments - at baseline                     General Comments       Exercises       Shoulder Instructions      Home Living Family/patient expects to be discharged to:: Private residence Living Arrangements: Spouse/significant other (80 y.o. independent)  Available Help at Discharge: Family;Available 24 hours/day Type of Home: House Home Access: Stairs to enter Entergy CorporationEntrance Stairs-Number of Steps: 2 Entrance Stairs-Rails: Right Home Layout: One level               Home Equipment: Cane - single point          Prior Functioning/Environment Level of Independence: Needs assistance  Gait / Transfers Assistance Needed: pt ambulates independently, uses a cane when outside or when tired.     Communication / Swallowing Assistance Needed: at baseline expressive aphasia and memory problems.       OT Diagnosis: Cognitive deficits;Other (comment) (right hemiparesis)   OT Problem List: Decreased strength;Decreased coordination;Impaired balance (sitting and/or standing);Decreased safety awareness;Decreased knowledge of use of DME or AE;Decreased knowledge of precautions;Impaired UE functional use   OT Treatment/Interventions: Self-care/ADL training;Therapeutic exercise;Neuromuscular education;DME and/or AE instruction;Therapeutic activities;Cognitive remediation/compensation;Patient/family education;Balance training    OT Goals(Current goals can be found in the care plan section) Acute Rehab OT Goals Patient Stated Goal: unable to state OT Goal Formulation: Patient unable to participate in goal setting Time For Goal Achievement: 12/02/13 Potential to Achieve Goals: Good ADL Goals Pt Will Perform Grooming: with set-up;standing;with supervision Pt Will Perform Lower Body Bathing: with set-up;with supervision;sit to/from stand Pt Will Perform Lower Body Dressing: with set-up;with supervision;sit to/from stand Pt Will Transfer to Toilet: with min guard assist;ambulating;bedside commode Pt Will Perform Toileting - Clothing Manipulation and hygiene: with supervision;sit to/from stand  OT Frequency: Min 2X/week   Barriers to D/C:            Co-evaluation              End of Session Equipment Utilized During Treatment: Gait belt;Rolling walker  Activity Tolerance: Other (comment) (agitated/decreased cognition) Patient left: in chair;with nursing/sitter in room;with family/visitor present;with chair alarm set   Time: 1513-1530 OT Time Calculation (min): 17 min Charges:  OT General Charges $OT Visit: 1 Procedure OT Evaluation $Initial OT Evaluation Tier I: 1 Procedure G-CodesEarlie Raveling:    Adem Costlow L OTR/L [phone] 11/18/2013, 5:05 PM

## 2013-11-19 LAB — CBC
HCT: 44.7 % (ref 39.0–52.0)
Hemoglobin: 15.3 g/dL (ref 13.0–17.0)
MCH: 32.3 pg (ref 26.0–34.0)
MCHC: 34.2 g/dL (ref 30.0–36.0)
MCV: 94.5 fL (ref 78.0–100.0)
Platelets: 193 10*3/uL (ref 150–400)
RBC: 4.73 MIL/uL (ref 4.22–5.81)
RDW: 12.6 % (ref 11.5–15.5)
WBC: 11.6 10*3/uL — ABNORMAL HIGH (ref 4.0–10.5)

## 2013-11-19 LAB — GLUCOSE, CAPILLARY
GLUCOSE-CAPILLARY: 131 mg/dL — AB (ref 70–99)
Glucose-Capillary: 124 mg/dL — ABNORMAL HIGH (ref 70–99)
Glucose-Capillary: 142 mg/dL — ABNORMAL HIGH (ref 70–99)

## 2013-11-19 LAB — BASIC METABOLIC PANEL
Anion gap: 19 — ABNORMAL HIGH (ref 5–15)
BUN: 17 mg/dL (ref 6–23)
CALCIUM: 9.1 mg/dL (ref 8.4–10.5)
CO2: 22 mEq/L (ref 19–32)
Chloride: 98 mEq/L (ref 96–112)
Creatinine, Ser: 0.97 mg/dL (ref 0.50–1.35)
GFR calc non Af Amer: 71 mL/min — ABNORMAL LOW (ref >90)
GFR, EST AFRICAN AMERICAN: 82 mL/min — AB (ref >90)
Glucose, Bld: 122 mg/dL — ABNORMAL HIGH (ref 70–99)
POTASSIUM: 3.9 meq/L (ref 3.7–5.3)
SODIUM: 139 meq/L (ref 137–147)

## 2013-11-19 MED ORDER — QUETIAPINE FUMARATE 25 MG PO TABS
12.5000 mg | ORAL_TABLET | Freq: Once | ORAL | Status: AC
  Administered 2013-11-19: 12.5 mg via ORAL
  Filled 2013-11-19: qty 1

## 2013-11-19 MED ORDER — LEVETIRACETAM 250 MG PO TABS
250.0000 mg | ORAL_TABLET | Freq: Two times a day (BID) | ORAL | Status: DC
  Administered 2013-11-19 – 2013-11-20 (×3): 250 mg via ORAL
  Filled 2013-11-19 (×3): qty 1

## 2013-11-19 MED ORDER — ENSURE COMPLETE PO LIQD: 237.0000 mL | Freq: Two times a day (BID) | ORAL | Status: DC | PRN

## 2013-11-19 MED ORDER — QUETIAPINE FUMARATE 25 MG PO TABS
25.0000 mg | ORAL_TABLET | Freq: Two times a day (BID) | ORAL | Status: DC
  Administered 2013-11-19 – 2013-11-20 (×3): 25 mg via ORAL
  Filled 2013-11-19 (×3): qty 1

## 2013-11-19 NOTE — Progress Notes (Signed)
Pt has not had a sitter since 3 pm 11/18/2013. Sondra ComeSilva, Maurizio Geno M, RN

## 2013-11-19 NOTE — Progress Notes (Signed)
STROKE TEAM PROGRESS NOTE   HISTORY Ronald Roberts is a [age] y.o. 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 His wife helped him to get up to go to the bathroom around 3 AM 10/19/2015and at that time, he was started experiencing symptoms but she was able to give him there. Following this, he went back to bed and she remained awake. When she went back to check on him later, she was unable to get him up and therefore he was brought via EMS. At baseline, he has non-dysarthric speech but his words are often nonsensical, though he can at times have simple conversations. He does have a history of seizures, however he has not had right-sided weakness following previous seizures. Seizures are partial involving his right arm and leg. He was last known well at 8:30 PM on 11/17/2013. Patient was not administered TPA secondary to delay in arrival. He was admitted for further evaluation and treatment.   SUBJECTIVE (INTERVAL HISTORY) His son is at the bedside, Patient was agitated last night needing sedation and posey restraint  OBJECTIVE Temp:  [97.7 F (36.5 C)-100.1 F (37.8 C)] 98.7 F (37.1 C) (10/20 1418) Pulse Rate:  [58-95] 78 (10/20 1418) Cardiac Rhythm:  [-] Atrial fibrillation (10/20 0800) Resp:  [18-24] 20 (10/20 1418) BP: (124-173)/(47-103) 154/96 mmHg (10/20 1418) SpO2:  [94 %-100 %] 95 % (10/20 1418)   Recent Labs Lab 11/18/13 1024 11/18/13 1150 11/18/13 1655 11/19/13 0800 11/19/13 1143  GLUCAP 73 82 119* 124* 142*    Recent Labs Lab 11/17/13 1040 11/17/13 1048 11/19/13 0705  NA 139 142 139  K 4.2 4.1 3.9  CL 104 104 98  CO2 27  --  22  GLUCOSE 87 89 122*  BUN 22 22 17   CREATININE 1.12 1.10 0.97  CALCIUM 8.4  --  9.1    Recent Labs Lab 11/17/13 1040  AST 16  ALT 12  ALKPHOS 93  BILITOT 0.3  PROT 6.3  ALBUMIN 3.5    Recent Labs Lab  11/17/13 1040 11/17/13 1048 11/19/13 0705  WBC 4.3  --  11.6*  NEUTROABS 2.9  --   --   HGB 12.3* 12.9* 15.3  HCT 37.7* 38.0* 44.7  MCV 94.3  --  94.5  PLT 151  --  193   No results found for this basename: CKTOTAL, CKMB, CKMBINDEX, TROPONINI,  in the last 168 hours  Recent Labs  11/17/13 1040  LABPROT 15.9*  INR 1.26    Recent Labs  11/17/13 1223  COLORURINE YELLOW  LABSPEC 1.016  PHURINE 7.5  GLUCOSEU NEGATIVE  HGBUR SMALL*  BILIRUBINUR NEGATIVE  KETONESUR NEGATIVE  PROTEINUR NEGATIVE  UROBILINOGEN 0.2  NITRITE NEGATIVE  LEUKOCYTESUR NEGATIVE       Component Value Date/Time   CHOL 151 11/18/2013 0450   TRIG 91 11/18/2013 0450   HDL 47 11/18/2013 0450   CHOLHDL 3.2 11/18/2013 0450   VLDL 18 11/18/2013 0450   LDLCALC 86 11/18/2013 0450   Lab Results  Component Value Date   HGBA1C 5.8* 11/18/2013      Component Value Date/Time   LABOPIA NONE DETECTED 11/17/2013 1223   COCAINSCRNUR NONE DETECTED 11/17/2013 1223   LABBENZ NONE DETECTED 11/17/2013 1223   AMPHETMU NONE DETECTED 11/17/2013 1223   THCU NONE DETECTED 11/17/2013 1223   LABBARB NONE DETECTED 11/17/2013 1223     Recent Labs Lab 11/17/13 1040  ETH <11  Mr Maxine GlennMra Head/brain Wo Cm  11/18/2013   CLINICAL DATA:  Right arm weakness. Acute infarct left thalamus on MRI.  EXAM: MRA HEAD WITHOUT CONTRAST  TECHNIQUE: Angiographic images of the Circle of Willis were obtained using MRA technique without intravenous contrast.  COMPARISON:  Brain MRI 11/17/2013  FINDINGS: Images are moderately degraded by motion artifact. Visualized distal vertebral arteries are patent with the right being mildly dominant. PICA and AICA is origins are not well evaluated. Basilar artery is patent without definite stenosis, although its midportion is not well evaluated due to artifact. SCA origins are patent. Right PCA is unremarkable. There is occlusion of the left PCA new the T1-T2 junction. There is a patent right posterior  communicating artery. A patent left posterior communicating artery is not clearly identified. There is an approximately 3 mm conical outpouching from the supraclinoid left ICA in the expected region of the posterior communicating artery origin.  Visualized distal right cervical ICA is tortuous. Internal carotid arteries are patent from skullbase to carotid termini without evidence of significant stenosis. A1 and M1 segments are patent bilaterally without evidence of stenosis. Mild bilateral ACA and MCA branch vessel irregularity is present.  IMPRESSION: 1. Moderately motion degraded examination. 2. Occlusion of the proximal left PCA. 3. Major anterior circulation vessels patent without evidence of significant proximal stenosis. 4. 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   Electronically Signed   By: Sebastian AcheAllen  Grady   On: 11/18/2013 11:12   2D Echocardiogram  EF 55-60% with no source of embolus.   Carotid Doppler  No evidence of hemodynamically significant internal carotid artery stenosis. Vertebral artery flow is antegrade.   PHYSICAL EXAM Elderly Caucasian male not in distress.Awake alert. Afebrile. Head is nontraumatic. Neck is supple without bruit. Hearing is normal. Cardiac exam no murmur or gallop. Lungs are clear to auscultation. Distal pulses are well felt. Neurological Exam : sleepy barely arouses.speech tangential with flight of ideas. Poor insight, registration and recall.follows only 1 step commands. Face symmetric. tongue midline. Eye movements full. Fundi not visualized. Blinks to threat bilaterally.MILD  RUE drift and diminished fine finger movements on right and rightt grip weakness. Moves LE   well bilaterally.Gait deferred. Sensation preserved. ASSESSMENT/PLAN Ronald Roberts is a 80 y.o. male with history of aphasia and memory loss following herpes encephalitis in 2006 presenting with right-sided weakness. He did not receive IV  t-PA due to delay in arrival.   Stroke:  Dominant left lateral thalamic infarct secondary to occlusion of the L PCA  MRI  L thalamic infarct  MRA  L PCA occlusion. 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   Carotid Doppler  No significant stenosis   2D Echo  No source of embolus   HgbA1c pending  Lovenox 40 mg sq daily for VTE prophylaxis General  Bedrest, OOB with assistance  no antithrombotics prior to admission, now on aspirin 300 mg suppository  Ongoing aggressive risk factor management  Therapy recommendations:  SNF  Disposition:  SNF  left posterior communicating artery origin aneurysm   Incidental   Small, 3 mm   No treatment indicated  Seizures - Localization-related (focal) (partial) epilepsy and epileptic syndromes with complex partial seizures, without mention of intractable epilepsy   Hx simple partial seizures involving his right arm and leg  No hx of right sided weakness post seizure  On Keppra 250 mg twice a day and Dilantin 200 mg ER   Atrial Fibrillation  Home meds:  No anticoagulation  Consider anticoagulation - not on prior to admission due to fall risk   Hyperlipidemia  Home meds:  No statin  LDL 86, goal < 70  Consider adding low dose statin  Other Stroke Risk Factors Advanced age   Obesity, Body mass index is 29.12 kg/(m^2).    Family hx stroke (mother)  Hospital day # 2  Annie Main, MSN, RN, ANVP-BC, ANP-BC, GNP-BC [facility] Pager: [phone] 11/19/2013 3:13 PM   I have personally examined this patient, reviewed notes, independently viewed imaging studies, participated in medical decision making and plan of care. I have made any additions or clarifications directly to the above note. Agree with note above.   Delia Heady, MD Medical Director [facility] Pager: [phone] 11/19/2013 3:13 PM   To contact Stroke Continuity provider, please refer to  [email or web address]. After hours, contact General Neurology

## 2013-11-19 NOTE — Progress Notes (Signed)
[facility] PROGRESS NOTE  Ronald Roberts [name] DOB: [birth year] DOA: 11/17/2013 PCP: Lorenda PeckOBERTS, Ronald Roberts  Interim Summary  Patient is a pleasant 80 year old gentleman with a past medical history of herpes encephalitis, cognitive impairment, seizure disorder who was admitted to the medicine service on 11/17/2013 presenting with complaints of right-sided weakness and dysarthria. He was initially worked up with CT scan of brain which showed no acute intracranial abnormalities. Left temporal lobe encephalomalacia was noted. He was treated with aspirin 325 mg by mouth daily. Neurology consulted, patient placed on CVA protocol. MRI revealed a 1 severe acute infarction involving the left lateral thalamus. Transthoracic echocardiogram showed LVEF of 60-65% and carotid Dopplers did not reveal significant stenosis. physical therapy recommending SNF placement. During this hospitalization patient becoming severely agitated and required administration of antipsychotic therapy with Seroquel 25 mg by mouth twice a day.                                                                                                                                                                                                                                                    Assessment/Plan: 1. Acute CVA -Patient presenting with complaints of dysarthria and right-sided weakness, MRI of the brain showing a 1 cm acute infarction within the left lateral thalamus. -Continue aspirin 325 mg by mouth daily. Patient having history of atrial fibrillation and had not been on anticoagulation. There had been concern for falls. I spoke with Dr. Pearlean BrownieSethi who did not recommend anticoagulation at this point. -Added Lipitor 20 mg by mouth daily, LDL at 86 -Systolic blood pressures in the 130s to 150s -Transthoracic echocardiogram showing ejection fraction of 60-65% -Speech pathology recommending regular diet with thin  liquid. -Physical therapy recommending SNF placement  2.  Dyslipidemia -Fasting panel showing LDL of 86 -Will add Lipitor 20 mg by mouth daily  3.  History of atrial fibrillation -Currently rate controlled. Patient had not been on anticoagulation due to recurrent falls -It is possible acute CVA may have resulted from thromboembolic phenomenon -Neurology not recommending anticoagulation at this point  4. Seizure disorder -Continue Keppra 250 mg twice a day and Dilantin 200 mg PO q daily  5.  Hypothyroidism -Will continue Synthroid 50 mcg by mouth daily  Code Status: Full code Family Communication: I spoke to his wife and son who were present  at bedside Disposition Plan: Anticipate discharge to SNF when stable   Consultants:  Neurology  Procedures:  Transthoracic echocardiogram performed on 11/18/2013 impression: LVEF 60-65%   HPI/Subjective: Patient agitated, confused, disoriented, not following commands.   Objective: Filed Vitals:   11/19/13 1645  BP: 152/69  Pulse: 71  Temp: 98.2 F (36.8 C)  Resp: 20    Intake/Output Summary (Last 24 hours) at 11/19/13 1652 Last data filed at 11/18/13 1800  Gross per 24 hour  Intake      0 ml  Output    275 ml  Net   -275 ml   Filed Weights   11/17/13 1123  Weight: 79.379 kg (175 lb)    Exam:   General:  Patient is confused and disoriented, agitated, unable to follow  Cardiovascular: Regular rate rhythm normal S1-S2 no murmurs or gallop  Respiratory: Clear to auscultation bilaterally no wheezing rhonchi while  Abdomen: Soft nontender nondistended  Musculoskeletal: No edema  Neurological: Patient is dysarthric, difficult to perform proper neurologic exam as he was unable to participate given current agitation  Data Reviewed: Basic Metabolic Panel:  Recent Labs Lab 11/17/13 1040 11/17/13 1048 11/19/13 0705  NA 139 142 139  K 4.2 4.1 3.9  CL 104 104 98  CO2 27  --  22  GLUCOSE 87 89 122*  BUN 22 22  17   CREATININE 1.12 1.10 0.97  CALCIUM 8.4  --  9.1   Liver Function Tests:  Recent Labs Lab 11/17/13 1040  AST 16  ALT 12  ALKPHOS 93  BILITOT 0.3  PROT 6.3  ALBUMIN 3.5   No results found for this basename: LIPASE, AMYLASE,  in the last 168 hours No results found for this basename: AMMONIA,  in the last 168 hours CBC:  Recent Labs Lab 11/17/13 1040 11/17/13 1048 11/19/13 0705  WBC 4.3  --  11.6*  NEUTROABS 2.9  --   --   HGB 12.3* 12.9* 15.3  HCT 37.7* 38.0* 44.7  MCV 94.3  --  94.5  PLT 151  --  193   Cardiac Enzymes: No results found for this basename: CKTOTAL, CKMB, CKMBINDEX, TROPONINI,  in the last 168 hours BNP (last 3 results) No results found for this basename: PROBNP,  in the last 8760 hours CBG:  Recent Labs Lab 11/18/13 1150 11/18/13 1655 11/19/13 0800 11/19/13 1143 11/19/13 1636  GLUCAP 82 119* 124* 142* 131*    No results found for this or any previous visit (from the past 240 hour(s)).   Studies: Mr Maxine GlennMra Head/brain Wo Cm  11/18/2013   CLINICAL DATA:  Right arm weakness. Acute infarct left thalamus on MRI.  EXAM: MRA HEAD WITHOUT CONTRAST  TECHNIQUE: Angiographic images of the Circle of Willis were obtained using MRA technique without intravenous contrast.  COMPARISON:  Brain MRI 11/17/2013  FINDINGS: Images are moderately degraded by motion artifact. Visualized distal vertebral arteries are patent with the right being mildly dominant. PICA and AICA is origins are not well evaluated. Basilar artery is patent without definite stenosis, although its midportion is not well evaluated due to artifact. SCA origins are patent. Right PCA is unremarkable. There is occlusion of the left PCA new the T1-T2 junction. There is a patent right posterior communicating artery. A patent left posterior communicating artery is not clearly identified. There is an approximately 3 mm conical outpouching from the supraclinoid left ICA in the expected region of the posterior  communicating artery origin.  Visualized distal right cervical ICA is tortuous.  Internal carotid arteries are patent from skullbase to carotid termini without evidence of significant stenosis. A1 and M1 segments are patent bilaterally without evidence of stenosis. Mild bilateral ACA and MCA branch vessel irregularity is present.  IMPRESSION: 1. Moderately motion degraded examination. 2. Occlusion of the proximal left PCA. 3. Major anterior circulation vessels patent without evidence of significant proximal stenosis. 4. 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   Electronically Signed   By: Sebastian Ache   On: 11/18/2013 11:12    Scheduled Meds: . aspirin  300 mg Rectal Daily   Or  . aspirin  325 mg Oral Daily  . atorvastatin  20 mg Oral q1800  . enoxaparin (LOVENOX) injection  40 mg Subcutaneous Q24H  . levETIRAcetam  250 mg Oral BID  . levothyroxine  50 mcg Oral QAC breakfast  . phenytoin  200 mg Oral Q2000  . QUEtiapine  25 mg Oral BID   Continuous Infusions:    Principal Problem:   Acute ischemic stroke Active Problems:   Chronic atrial fibrillation   Hypothyroidism   Seizure disorder   History of herpes encephalitis    Time spent: 25 min    Jeralyn Bennett  [facility] Pager [phone]. If 7PM-7AM, please contact night-coverage at [email or web address], password [facility] 11/19/2013, 4:52 PM  LOS: 2 days

## 2013-11-19 NOTE — Progress Notes (Signed)
INITIAL NUTRITION ASSESSMENT  DOCUMENTATION CODES Per approved criteria  -Not Applicable   INTERVENTION: Provide Ensure Complete BID PRN RD to continue to monitor for PO adequacy  NUTRITION DIAGNOSIS: Predicted sub optimal energy intak related to recent stroke as evidenced by pt's chart and low Braden score.   Goal: Pt to meet >/= 90% of their estimated nutrition needs   Monitor:  PO intake, weight trend, labs  Reason for Assessment: Low Braden Score  80 y.o. male  Admitting Dx: Acute ischemic stroke  ASSESSMENT: 80 y.o. male with h/o herpes encephalitis, resulting seizure disorder, chronic atrial fibrillation and hypothyroidism who arrives to ED via EMS as code stroke. CT brain negative, but MRI shows 1 cm acute infarction left lateral thalamus.  At baseline usually walks unassisted. When tired, uses cane for balance.   Pt asleep at time of visit, 1530 hr, with lunch tray at bedside untouched. Patient was assessed by SLP yesterday and diet was advanced to regular later that day. Per review of weight history below, pt's weight has been stable for the past few years.  Per MD note, patient was agitated last night needing sedation and posey restraint   Labs reviewed.   Height: Ht Readings from Last 1 Encounters:  11/17/13 5\' 5"  (1.651 m)    Weight: Wt Readings from Last 1 Encounters:  11/17/13 175 lb (79.379 kg)    Ideal Body Weight: 136 lbs  % Ideal Body Weight: 129%  Wt Readings from Last 10 Encounters:  11/17/13 175 lb (79.379 kg)  07/02/13 174 lb (78.926 kg)  12/12/12 178 lb (80.74 kg)  11/21/12 180 lb (81.647 kg)  01/21/11 175 lb (79.379 kg)  01/06/11 177 lb 8 oz (80.513 kg)  12/28/10 169 lb 12.1 oz (77 kg)  12/28/10 169 lb 12.1 oz (77 kg)  12/20/10 173 lb 3 oz (78.557 kg)  12/15/10 178 lb 4 oz (80.854 kg)    Usual Body Weight: 175 lbs  % Usual Body Weight: 100%  BMI:  Body mass index is 29.12 kg/(m^2).  Estimated Nutritional Needs: Kcal:  1750-1900 Protein: 85-95 grams Fluid: 1.7-1.9 L/day  Skin: intact  Diet Order: General  EDUCATION NEEDS: -No education needs identified at this time   Intake/Output Summary (Last 24 hours) at 11/19/13 1539 Last data filed at 11/18/13 1800  Gross per 24 hour  Intake      0 ml  Output    275 ml  Net   -275 ml    Last BM: 10/16   Labs:   Recent Labs Lab 11/17/13 1040 11/17/13 1048 11/19/13 0705  NA 139 142 139  K 4.2 4.1 3.9  CL 104 104 98  CO2 27  --  22  BUN 22 22 17   CREATININE 1.12 1.10 0.97  CALCIUM 8.4  --  9.1  GLUCOSE 87 89 122*    CBG (last 3)   Recent Labs  11/18/13 1655 11/19/13 0800 11/19/13 1143  GLUCAP 119* 124* 142*    Scheduled Meds: . aspirin  300 mg Rectal Daily   Or  . aspirin  325 mg Oral Daily  . atorvastatin  20 mg Oral q1800  . enoxaparin (LOVENOX) injection  40 mg Subcutaneous Q24H  . levETIRAcetam  250 mg Oral BID  . levothyroxine  50 mcg Oral QAC breakfast  . phenytoin  200 mg Oral Q2000  . QUEtiapine  25 mg Oral BID    Continuous Infusions:   Past Medical History  Diagnosis Date  . Arthritis   .  Heart murmur   . Hearing loss   . Blood in urine   . 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 Confusion   . Memory loss   . Aphasia   . Easy bruising   . Seizures     last seizure was   . Nocturia   . Hypothyroidism   . Snores   . Atrial fibrillation   . Localization-related (focal) (partial) epilepsy and epileptic syndromes with complex partial seizures, without mention of intractable epilepsy 12/12/2012  . Hypersomnia, unspecified   . Hypothyroidism     Past Surgical History  Procedure Laterality Date  . Prostate surgery  [phone] and 2006  . Joint replacement  1995    right hip replacement   . Inguinal hernia repair  12/28/2010    Procedure: HERNIA REPAIR INGUINAL ADULT;  Surgeon: Iona CoachWilliam J Weatherly, MD;  Location: [facility];  Service: General;  Laterality: Left;  With Mesh  .  Cataract extraction Bilateral     Ian Malkineanne Barnett RD, LDN Inpatient Clinical Dietitian Pager: [phone] After Hours Pager: [phone]

## 2013-11-19 NOTE — Progress Notes (Signed)
SLP Cancellation Note  Patient Details Name: Ronald Roberts MRN: [id number] DOB: 01/09/1923   Cancelled treatment:       Reason Eval/Treat Not Completed: Other (comment). Pts chart screened by SLP. Given nature of order and pt dx, SLP will complete evaluation/treatment as soon as possible, which may be within 24-48 hours.    Jamiesha Victoria, Riley NearingBonnie Caroline 11/19/2013, 2:41 PM

## 2013-11-20 LAB — BASIC METABOLIC PANEL
Anion gap: 14 (ref 5–15)
BUN: 25 mg/dL — ABNORMAL HIGH (ref 6–23)
CALCIUM: 9 mg/dL (ref 8.4–10.5)
CO2: 26 mEq/L (ref 19–32)
CREATININE: 1.24 mg/dL (ref 0.50–1.35)
Chloride: 104 mEq/L (ref 96–112)
GFR calc Af Amer: 58 mL/min — ABNORMAL LOW (ref >90)
GFR calc non Af Amer: 50 mL/min — ABNORMAL LOW (ref >90)
GLUCOSE: 148 mg/dL — AB (ref 70–99)
Potassium: 3.8 mEq/L (ref 3.7–5.3)
Sodium: 144 mEq/L (ref 137–147)

## 2013-11-20 LAB — CBC
HCT: 42.5 % (ref 39.0–52.0)
Hemoglobin: 14.5 g/dL (ref 13.0–17.0)
MCH: 31.8 pg (ref 26.0–34.0)
MCHC: 34.1 g/dL (ref 30.0–36.0)
MCV: 93.2 fL (ref 78.0–100.0)
Platelets: 159 10*3/uL (ref 150–400)
RBC: 4.56 MIL/uL (ref 4.22–5.81)
RDW: 12.8 % (ref 11.5–15.5)
WBC: 13 10*3/uL — ABNORMAL HIGH (ref 4.0–10.5)

## 2013-11-20 MED ORDER — QUETIAPINE FUMARATE 25 MG PO TABS: 25.0000 mg | ORAL_TABLET | Freq: Two times a day (BID) | ORAL | Status: AC

## 2013-11-20 MED ORDER — ATORVASTATIN CALCIUM 20 MG PO TABS: 20.0000 mg | ORAL_TABLET | Freq: Every day | ORAL | Status: DC

## 2013-11-20 MED ORDER — ASPIRIN 325 MG PO TABS: 325.0000 mg | ORAL_TABLET | Freq: Every day | ORAL | Status: AC

## 2013-11-20 MED ORDER — ENSURE COMPLETE PO LIQD: 237.0000 mL | Freq: Two times a day (BID) | ORAL | Status: DC | PRN

## 2013-11-20 MED ORDER — ATORVASTATIN CALCIUM 20 MG PO TABS: 20.0000 mg | ORAL_TABLET | Freq: Every day | ORAL | Status: AC

## 2013-11-20 MED ORDER — LEVETIRACETAM 250 MG PO TABS: 250.0000 mg | ORAL_TABLET | Freq: Two times a day (BID) | ORAL | Status: AC

## 2013-11-20 MED ORDER — PHENYTOIN SODIUM EXTENDED 200 MG PO CAPS: 200.0000 mg | ORAL_CAPSULE | Freq: Every day | ORAL | Status: DC

## 2013-11-20 NOTE — Progress Notes (Signed)
Physical Therapy Treatment Patient Details Name: Ronald SkyeWilliam S Maclay MRN: [id number] DOB: 1922/02/04 Today's Date: 11/20/2013    History of Present Illness 80 y.o. male [facility] with slurred speech and right sided weakness. MRI revealed, acute infarction within the left lateral thalamus.  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      PT Comments    Pt progressing slowly, limited today by lethargy. Worked on transfers and pregait activities, pt requiring mod physical assist as well as for safety. PT will continue to follow.   Follow Up Recommendations  SNF     Equipment Recommendations  Rolling walker with 5" wheels    Recommendations for Other Services       Precautions / Restrictions Precautions Precautions: Fall Precaution Comments: due to cognition and R sided weakness Restrictions Weight Bearing Restrictions: No    Mobility  Bed Mobility               General bed mobility comments: pt received in chair  Transfers Overall transfer level: Needs assistance Equipment used: Rolling walker (2 wheeled) Transfers: Sit to/from Stand Sit to Stand: +2 physical assistance;Mod assist         General transfer comment: pt lethargic but participatory. Had been very agitated this morning and becomes slightly so with activity but was following at least half of commands. Fwd flexion maintained with each trial of standing, pt bracing legs against chair. Maintained standing x 5 mins each time for donning of clothes. Pt unable to hold RW with right hand, extra support given on that side   Ambulation/Gait             General Gait Details: pregait performed in standing with wt shifting and work on erect posture but pt with posture not safe for ambulation today and unable to take wt fully fwd onto [employer]   Stairs            Wheelchair Mobility     Modified Rankin (Stroke Patients Only) Modified Rankin (Stroke Patients Only) Pre-Morbid Rankin Score: No significant disability Modified Rankin: Severe disability     Balance Overall balance assessment: Needs assistance Sitting-balance support: Feet supported Sitting balance-Leahy Scale: Fair Sitting balance - Comments: due to lethargy, pt unable to tolerate dynamic activity when sitting edge of chair unless UE's supported Postural control: Posterior lean Standing balance support: Bilateral upper extremity supported;During functional activity Standing balance-Leahy Scale: Poor Standing balance comment: requires mod A to maintain standing                    Cognition Arousal/Alertness: Lethargic Behavior During Therapy: Agitated Overall Cognitive Status: History of cognitive impairments - at baseline       Memory: Decreased short-term memory;Decreased recall of precautions              Exercises General Exercises - Lower Extremity Ankle Circles/Pumps: AROM;Both;10 reps;Seated Long Arc Quad: AROM;Both;10 reps;Seated Heel Slides: AROM;Both;10 reps;Seated Hip Flexion/Marching: AROM;Both;10 reps;Seated    General Comments        Pertinent Vitals/Pain Pain Assessment: No/denies pain    Home Living                      Prior Function            PT Goals (current goals can now be found in the care plan section) Acute Rehab PT Goals Patient Stated  Goal: pt's wife hoping for d/c to SNF today PT Goal Formulation: With family Time For Goal Achievement: 12/02/13 Potential to Achieve Goals: Good Progress towards PT goals: Progressing toward goals    Frequency  Min 3X/week    PT Plan Current plan remains appropriate    Co-evaluation             End of Session Equipment Utilized During Treatment: Gait belt Activity Tolerance: Patient limited by lethargy Patient left: in chair;with call bell/phone within reach;with family/visitor  present     Time: 1230-1255 PT Time Calculation (min): 25 min  Charges:  $Therapeutic Activity: 23-37 mins                    G Codes:     Lyanne CoVictoria Sulema Braid, PT  Acute Rehab Services  [phone]  Lyanne CoManess, Feliz Lincoln 11/20/2013, 1:45 PM

## 2013-11-20 NOTE — Progress Notes (Signed)
Patient is discharged from room 4N31 at this time. Alert and in stable condition. IV site d/c'd as well as tele. [facility] Transported by [facility] via stretcher. Wife and daughter at side with all belongings.

## 2013-11-20 NOTE — Clinical Social Work Note (Addendum)
CSW [facility] pt[facility]. [facility] informed CSW that they gave the pt's bed away. CSW attempted to contact the pt's wife and daughter-Beth to informed them about the change. CSW left a voice message for the family. The pt has two additional offers [facility].   Addendum:  CSW updated by [facility] that they will take the pt today. CSW contacted family again to inform them.   Alayne Estrella, MSW, LCSWA [phone]

## 2013-11-20 NOTE — Discharge Summary (Signed)
Physician Discharge Summary  Patient ID: Lucious Zou Derosa MRN: [id number] DOB/AGE: Mar 25, 1922 80 y.o.  Admit date: 11/17/2013 Discharge date: 11/20/2013  Primary Care Physician:  Lorenda Peck, MD  Discharge Diagnoses:    . Chronic atrial fibrillation . Acute ischemic stroke . Hypothyroidism . History of herpes encephalitis  Consults: Neurology/stroke service   Recommendations for Outpatient Follow-up:  Fall precautions Aspiration precautions  Allergies:   Allergies  Allergen Reactions  . Vancomycin Rash     Discharge Medications:   Medication List    STOP taking these medications       PHENYTOIN INFATABS 50 MG tablet  Generic drug:  phenytoin      TAKE these medications       aspirin 325 MG tablet  Take 1 tablet (325 mg total) by mouth daily.     atorvastatin 20 MG tablet  Commonly known as:  LIPITOR  Take 1 tablet (20 mg total) by mouth at bedtime.     cholecalciferol 1000 UNITS tablet  Commonly known as:  VITAMIN D  Take 1,000 Units by mouth daily.     feeding supplement (ENSURE COMPLETE) Liqd  Take 237 mLs by mouth 2 (two) times daily between meals as needed (Please offer if pt skips/misses a meal or consumes < 50%).     levETIRAcetam 250 MG tablet  Commonly known as:  KEPPRA  Take 1 tablet (250 mg total) by mouth 2 (two) times daily.     levothyroxine 50 MCG tablet  Commonly known as:  SYNTHROID, LEVOTHROID  Take 50 mcg by mouth every morning.     phenytoin 200 MG ER capsule  Commonly known as:  DILANTIN  Take 1 capsule (200 mg total) by mouth daily at 8 pm.     QUEtiapine 25 MG tablet  Commonly known as:  SEROQUEL  Take 1 tablet (25 mg total) by mouth 2 (two) times daily.         Brief H and P: For complete details please refer to admission H and P, but in brief FOREST REDWINE is a 80 y.o. male  with h/o herpes encephalitis, resulting seizure disorder, chronic atrial fibrillation and hypothyroidism who arrives to ED via EMS as  code stroke. Last seen normal at 8:30 pm last night. Family noticed his speech was slurred and he had right arm weakness. Also, unable to stand on his own. Sleepier than usual. No known seizure activity. At baseline usually walks unassisted. When tired, uses cane for balance. Has fallen a few times over the past few months. On ASA 325 mg daily. CT brain negative, but MRI shows 1 cm acute infarction left lateral thalamus. Question early restricted diffusion in the left posterior temporal cortical brain.    Hospital Course:  Patient is a pleasant 80 year old gentleman with a past medical history of herpes encephalitis, cognitive impairment, seizure disorder who was admitted to the medicine service on 11/17/2013 presenting with complaints of right-sided weakness and dysarthria. He was initially worked up with CT scan of brain which showed no acute intracranial abnormalities. Left temporal lobe encephalomalacia was noted. He was treated with aspirin 325 mg by mouth daily. Neurology was consulted. MRI revealed a 1 severe acute infarction involving the left lateral thalamus. Transthoracic echocardiogram showed LVEF of 60-65% and carotid Dopplers did not reveal significant stenosis. physical therapy recommending SNF placement. During this hospitalization patient becoming severely agitated and required administration of antipsychotic therapy with Seroquel 25 mg by mouth twice a day.   1. Acute CVA:  Patient presented with complaints of dysarthria and right-sided weakness, MRI of the brain showed a 1 cm acute infarction within the left lateral thalamus. Continue aspirin 325 mg by mouth daily. Patient has history of atrial fibrillation and was not on any progression to prior concern of falls. Dr. Vanessa Barbara spoke with Dr. CT from stroke service who did not recommend anticoagulation at this point. Patient was placed on low-dose statin. PT evaluation recommended skilled nursing facility. Speech pathology recommended regular  diet with thin liquids.  2. Dyslipidemia  -Fasting panel showing LDL of 86, continue low-dose Lipitor   3. History of atrial fibrillation  -Currently rate controlled. Patient had not been on anticoagulation due to recurrent falls, it is possible acute CVA may have resulted from thromboembolic phenomenon. Neurology did not recommend starting anticoagulation at this point.    4. Seizure disorder  -Continue Keppra 250 mg twice a day and Dilantin 200 mg PO q daily   5. Hypothyroidism   continue Synthroid 50 mcg by mouth daily   6. Acute encephalopathy: Improving likely due to #1 and sundowning, CT head and MRI showed atrophy and small vessel disease, likely underlying dementia. Continue Seroquel.   Day of Discharge BP 118/56  Pulse 70  Temp(Src) 98.3 F (36.8 C) (Oral)  Resp 20  Ht 5\' 5"  (1.651 m)  Wt 79.379 kg (175 lb)  BMI 29.12 kg/m2  SpO2 94%  Physical Exam: General: Alert and awake, NAD CVS: S1-S2 clear no murmur rubs or gallops Chest: clear to auscultation bilaterally, no wheezing rales or rhonchi Abdomen: soft nontender, nondistended, normal bowel sounds Extremities: no cyanosis, clubbing or edema noted bilaterally    The results of significant diagnostics from this hospitalization (including imaging, microbiology, ancillary and laboratory) are listed below for reference.    LAB RESULTS: Basic Metabolic Panel:  Recent Labs Lab 11/19/13 0705 11/20/13 0437  NA 139 144  K 3.9 3.8  CL 98 104  CO2 22 26  GLUCOSE 122* 148*  BUN 17 25*  CREATININE 0.97 1.24  CALCIUM 9.1 9.0   Liver Function Tests:  Recent Labs Lab 11/17/13 1040  AST 16  ALT 12  ALKPHOS 93  BILITOT 0.3  PROT 6.3  ALBUMIN 3.5   No results found for this basename: LIPASE, AMYLASE,  in the last 168 hours No results found for this basename: AMMONIA,  in the last 168 hours CBC:  Recent Labs Lab 11/17/13 1040  11/19/13 0705 11/20/13 0437  WBC 4.3  --  11.6* 13.0*  NEUTROABS 2.9  --    --   --   HGB 12.3*  < > 15.3 14.5  HCT 37.7*  < > 44.7 42.5  MCV 94.3  --  94.5 93.2  PLT 151  --  193 159  < > = values in this interval not displayed. Cardiac Enzymes: No results found for this basename: CKTOTAL, CKMB, CKMBINDEX, TROPONINI,  in the last 168 hours BNP: No components found with this basename: POCBNP,  CBG:  Recent Labs Lab 11/19/13 1143 11/19/13 1636  GLUCAP 142* 131*    Significant Diagnostic Studies:  Ct Head Wo Contrast  11/17/2013   CLINICAL DATA:  Code stroke.  EXAM: CT HEAD WITHOUT CONTRAST  TECHNIQUE: Contiguous axial images were obtained from the base of the skull through the vertex without intravenous contrast.  COMPARISON:  09/01/2006.  FINDINGS: Prominence of the sulci and ventricles noted compatible with brain atrophy. There is a large area of encephalomalacia involving the left temporal lobe compatible with  history of prior HSV infection. Areas of low attenuation within the subcortical white matter and periventricular white matter noted compatible with chronic small vessel ischemic disease. There is no evidence for acute intracranial hemorrhage, infarct or mass. The paranasal sinuses are clear.  IMPRESSION: 1. No acute intracranial abnormalities. 2. Left temporal lobe encephalomalacia as before. 3. Small vessel ischemic disease and brain atrophy.   Electronically Signed   By: Signa Kellaylor  Stroud M.D.   On: 11/17/2013 10:44   Mr Brain Wo Contrast  11/17/2013   CLINICAL DATA:  Initial encounter. Confusion. Speech disturbance. Right arm weakness.  EXAM: MRI HEAD WITHOUT CONTRAST  TECHNIQUE: Multiplanar, multiecho pulse sequences of the brain and surrounding structures were obtained without intravenous contrast.  COMPARISON:  Head CT same day.  FINDINGS: The study shows significant motion degradation.  There is a 1 cm acute infarction within the left lateral thalamus. No other acute infarction. One could question early low level restricted diffusion in the left  parietal cortical brain inferiorly, but this is not definite.  No focal brainstem insult. No focal cerebellar insult. There is old infarction of the left temporal lobe which has progressed to atrophy and encephalomalacia with adjacent gliosis. There is ordinary moderate chronic small-vessel ischemic change within the cerebral hemispheric deep white matter. No large vessel territory infarction. No mass lesion, hemorrhage, hydrocephalus or extra-axial collection. No pituitary mass. No inflammatory sinus disease. No skull or skullbase lesion.  IMPRESSION: 1 cm acute infarction left lateral thalamus.  Question early restricted diffusion in the left posterior temporal cortical brain. This may simply be artifactual, but early ischemia in that area is not completely ruled out.  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  Chronic small vessel disease.   Electronically Signed   By: Paulina FusiMark  Shogry M.D.   On: 11/17/2013 14:29   Mr Maxine GlennMra Head/brain Wo Cm  11/18/2013   CLINICAL DATA:  Right arm weakness. Acute infarct left thalamus on MRI.  EXAM: MRA HEAD WITHOUT CONTRAST  TECHNIQUE: Angiographic images of the Circle of Willis were obtained using MRA technique without intravenous contrast.  COMPARISON:  Brain MRI 11/17/2013  FINDINGS: Images are moderately degraded by motion artifact. Visualized distal vertebral arteries are patent with the right being mildly dominant. PICA and AICA is origins are not well evaluated. Basilar artery is patent without definite stenosis, although its midportion is not well evaluated due to artifact. SCA origins are patent. Right PCA is unremarkable. There is occlusion of the left PCA new the T1-T2 junction. There is a patent right posterior communicating artery. A patent left posterior communicating artery is not clearly identified. There is an approximately 3 mm conical outpouching from the supraclinoid left ICA in the expected region of the posterior communicating artery origin.  Visualized distal right  cervical ICA is tortuous. Internal carotid arteries are patent from skullbase to carotid termini without evidence of significant stenosis. A1 and M1 segments are patent bilaterally without evidence of stenosis. Mild bilateral ACA and MCA branch vessel irregularity is present.  IMPRESSION: 1. Moderately motion degraded examination. 2. Occlusion of the proximal left PCA. 3. Major anterior circulation vessels patent without evidence of significant proximal stenosis. 4. 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   Electronically Signed   By: Sebastian AcheAllen  Grady   On: 11/18/2013 11:12    2D ECHO: Study Conclusions  - Left ventricle: The cavity size was normal. Wall thickness was increased in a pattern of mild LVH. Systolic function was normal. The estimated  ejection fraction was in the range of 60% to 65%. - Aortic valve: There was mild to moderate regurgitation. - Mitral valve: There was mild regurgitation. - Left atrium: The atrium was moderately dilated. - Tricuspid valve: There was moderate regurgitation. - Pulmonary arteries: PA peak pressure: 42 mm Hg (S).    Disposition and Follow-up: Discharge Instructions   Diet - low sodium heart healthy    Complete by:  As directed      Increase activity slowly    Complete by:  As directed             DISPOSITION: Skilled nursing facility  DIET: Heart healthy diet   DISCHARGE FOLLOW-UP Follow-up Information   Follow up with Lesly DukesWILLIS,CHARLES KEITH, MD On 12/19/2013. (at 1:30 PM)    Specialty:  Neurology   Contact information:   [address] [place] [address] [phone]       Follow up with ROBERTS, Vernie AmmonsONALD WAYNE, MD. Schedule an appointment as soon as possible for a visit in 2 weeks. (for hospital follow-up)    Specialty:  Internal Medicine   Contact information:   [address] [place] [address] [phone]       Time spent on Discharge: 40  minutes  Signed:   RAI,RIPUDEEP M.D. [facility] 11/20/2013, 11:14 AM Pager: [phone]

## 2013-11-20 NOTE — Progress Notes (Signed)
STROKE TEAM PROGRESS NOTE   HISTORY Ronald BlanksWilliam S Meas is a 80 y.o. 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 His wife helped him to get up to go to the bathroom around 3 AM 10/19/2015and at that time, he was started experiencing symptoms but she was able to give him there. Following this, he went back to bed and she remained awake. When she went back to check on him later, she was unable to get him up and therefore he was brought via EMS. At baseline, he has non-dysarthric speech but his words are often nonsensical, though he can at times have simple conversations. He does have a history of seizures, however he has not had right-sided weakness following previous seizures. Seizures are partial involving his right arm and leg. He was last known well at 8:30 PM on 11/17/2013. Patient was not administered TPA secondary to delay in arrival. He was admitted for further evaluation and treatment.   SUBJECTIVE (INTERVAL HISTORY) Working with therapy.   OBJECTIVE Temp:  [97.7 F (36.5 C)-98.8 F (37.1 C)] 98.3 F (36.8 C) (10/21 1106) Pulse Rate:  [70-78] 70 (10/21 1106) Cardiac Rhythm:  [-] Atrial fibrillation (10/20 2010) Resp:  [20-22] 20 (10/21 1106) BP: (118-154)/(41-96) 118/56 mmHg (10/21 1106) SpO2:  [94 %-95 %] 94 % (10/21 1106)   Recent Labs Lab 11/18/13 1150 11/18/13 1655 11/19/13 0800 11/19/13 1143 11/19/13 1636  GLUCAP 82 119* 124* 142* 131*    Recent Labs Lab 11/17/13 1040 11/17/13 1048 11/19/13 0705 11/20/13 0437  NA 139 142 139 144  K 4.2 4.1 3.9 3.8  CL 104 104 98 104  CO2 27  --  22 26  GLUCOSE 87 89 122* 148*  BUN 22 22 17  25*  CREATININE 1.12 1.10 0.97 1.24  CALCIUM 8.4  --  9.1 9.0    Recent Labs Lab 11/17/13 1040  AST 16  ALT 12  ALKPHOS 93  BILITOT 0.3  PROT 6.3  ALBUMIN 3.5    Recent Labs Lab 11/17/13 1040 11/17/13 1048  11/19/13 0705 11/20/13 0437  WBC 4.3  --  11.6* 13.0*  NEUTROABS 2.9  --   --   --   HGB 12.3* 12.9* 15.3 14.5  HCT 37.7* 38.0* 44.7 42.5  MCV 94.3  --  94.5 93.2  PLT 151  --  193 159   No results found for this basename: CKTOTAL, CKMB, CKMBINDEX, TROPONINI,  in the last 168 hours No results found for this basename: LABPROT, INR,  in the last 72 hours No results found for this basename: COLORURINE, APPERANCEUR, LABSPEC, PHURINE, GLUCOSEU, HGBUR, BILIRUBINUR, KETONESUR, PROTEINUR, UROBILINOGEN, NITRITE, LEUKOCYTESUR,  in the last 72 hours     Component Value Date/Time   CHOL 151 11/18/2013 0450   TRIG 91 11/18/2013 0450   HDL 47 11/18/2013 0450   CHOLHDL 3.2 11/18/2013 0450   VLDL 18 11/18/2013 0450   LDLCALC 86 11/18/2013 0450   Lab Results  Component Value Date   HGBA1C 5.8* 11/18/2013      Component Value Date/Time   LABOPIA NONE DETECTED 11/17/2013 1223   COCAINSCRNUR NONE DETECTED 11/17/2013 1223   LABBENZ NONE DETECTED 11/17/2013 1223   AMPHETMU NONE DETECTED 11/17/2013 1223   THCU NONE DETECTED 11/17/2013 1223   LABBARB NONE DETECTED 11/17/2013 1223     Recent Labs Lab 11/17/13 1040  ETH <11    No results found. 2D Echocardiogram  EF 55-60% with  no source of embolus.   Carotid Doppler  No evidence of hemodynamically significant internal carotid artery stenosis. Vertebral artery flow is antegrade.   PHYSICAL EXAM Elderly Caucasian male not in distress.Awake alert. Afebrile. Head is nontraumatic. Neck is supple without bruit. Hearing is normal. Cardiac exam no murmur or gallop. Lungs are clear to auscultation. Distal pulses are well felt. Neurological Exam : sleepy barely arouses.speech tangential with flight of ideas. Poor insight, registration and recall.follows only 1 step commands. Face symmetric. tongue midline. Eye movements full. Fundi not visualized. Blinks to threat bilaterally.MILD  RUE drift and diminished fine finger movements on right and rightt grip  weakness. Moves LE   well bilaterally.Gait deferred. Sensation preserved.   ASSESSMENT/PLAN Mr. Ronald Roberts is a 80 y.o. male with history of aphasia and memory loss following herpes encephalitis in 2006 presenting with right-sided weakness. He did not receive IV t-PA due to delay in arrival.   Stroke:  Dominant left lateral thalamic infarct secondary to occlusion of the L PCA  MRI  L thalamic infarct  MRA  L PCA occlusion. 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  Patient has a 10-15% risk of having another stroke over the next year, the highest risk is within 2 weeks of the most recent stroke/TIA (risk of having a stroke following a stroke or TIA is the same).  Ongoing risk factor control by Primary Care Physician  Stroke Service will sign off. Please call should any needs arise.  [facility] appt scheduled.  left posterior communicating artery origin aneurysm   Incidental   Small, 3 mm   No treatment indicated  Seizures - Localization-related (focal) (partial) epilepsy and epileptic syndromes with complex partial seizures, without mention of intractable epilepsy   Hx simple partial seizures involving his right arm and leg  No hx of right sided weakness post seizure  On Keppra 250 mg twice a day and Dilantin 200 mg ER   Atrial Fibrillation  Home meds:  No anticoagulation  Consider anticoagulation - not on prior to admission due to fall risk   Hyperlipidemia  Home meds:  No statin  LDL 86, goal <  70  Consider adding low dose statin  Other Stroke Risk Factors Advanced age   Obesity, Body mass index is 29.12 kg/(m^2).    Family hx stroke (mother)  Hospital day # 3  Annie MainSHARON BIBY, MSN, RN, ANVP-BC, ANP-BC, Lawernce IonGNP-BC [facility] Pager: [phone] 11/20/2013 12:48 PM  I have personally examined this patient, reviewed notes, independently viewed imaging studies, participated in medical decision making and plan of care. I have made any additions or clarifications directly to the above note. Agree with note above.  Patient likely being discharged to SNF today.  Delia HeadyPramod Sethi, MD Medical Director [facility] Pager: [phone] 11/20/2013 3:04 PM    To contact Stroke Continuity provider, please refer to [email or web address]. After hours, contact General Neurology

## 2013-11-21 ENCOUNTER — Non-Acute Institutional Stay (SKILLED_NURSING_FACILITY): Payer: Medicare Other | Admitting: Internal Medicine

## 2013-11-21 DIAGNOSIS — E034 Atrophy of thyroid (acquired): Secondary | ICD-10-CM

## 2013-11-21 DIAGNOSIS — G40909 Epilepsy, unspecified, not intractable, without status epilepticus: Secondary | ICD-10-CM

## 2013-11-21 DIAGNOSIS — I482 Chronic atrial fibrillation, unspecified: Secondary | ICD-10-CM

## 2013-11-21 DIAGNOSIS — I6350 Cerebral infarction due to unspecified occlusion or stenosis of unspecified cerebral artery: Secondary | ICD-10-CM

## 2013-11-21 DIAGNOSIS — E785 Hyperlipidemia, unspecified: Secondary | ICD-10-CM

## 2013-11-21 DIAGNOSIS — E038 Other specified hypothyroidism: Secondary | ICD-10-CM

## 2013-11-21 DIAGNOSIS — I639 Cerebral infarction, unspecified: Secondary | ICD-10-CM

## 2013-11-21 DIAGNOSIS — G9340 Encephalopathy, unspecified: Secondary | ICD-10-CM

## 2013-11-21 NOTE — Progress Notes (Signed)
MRN: [id number] Name: Ronald Roberts  Sex: male Age: 80 y.o. DOB: Oct 10, 1922  PSC #: [facility] Facility/Room: 107A Level Of Care: SNF Provider: Merrilee Seashore D Emergency Contacts: Extended Emergency Contact Information Primary Emergency Contact: Kirst,Jean Address: [address]          [place] [address] [place] Home Phone: [phone] Relation: Spouse   Allergies: Vancomycin  Chief Complaint  Patient presents with  . New Admit To SNF    HPI: Patient is 80 y.o. male who is admitted to SNF for OT/PT after acute ischemic CVA.  Past Medical History  Diagnosis Date  . Arthritis   . Heart murmur   . Hearing loss   . Blood in urine   . 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 Confusion   . Memory loss   . Aphasia   . Easy bruising   . Seizures     last seizure was   . Nocturia   . Hypothyroidism   . Snores   . Atrial fibrillation   . Localization-related (focal) (partial) epilepsy and epileptic syndromes with complex partial seizures, without mention of intractable epilepsy 12/12/2012  . Hypersomnia, unspecified   . Hypothyroidism   . Hyperlipidemia     Past Surgical History  Procedure Laterality Date  . Prostate surgery  [phone] and 2006  . Joint replacement  1995    right hip replacement   . Inguinal hernia repair  12/28/2010    Procedure: HERNIA REPAIR INGUINAL ADULT;  Surgeon: Iona Coach, MD;  Location: [facility];  Service: General;  Laterality: Left;  With Mesh  . Cataract extraction Bilateral       Medication List       This list is accurate as of: 11/21/13 11:59 PM.  Always use your most recent med list.               aspirin 325 MG tablet  Take 1 tablet (325 mg total) by mouth daily.     atorvastatin 20 MG tablet  Commonly known as:  LIPITOR  Take 1 tablet (20 mg total) by mouth at bedtime.     cholecalciferol 1000 UNITS tablet  Commonly known as:  VITAMIN D  Take 1,000 Units by  mouth daily.     feeding supplement (ENSURE COMPLETE) Liqd  Take 237 mLs by mouth 2 (two) times daily between meals as needed (Please offer if pt skips/misses a meal or consumes < 50%).     levETIRAcetam 250 MG tablet  Commonly known as:  KEPPRA  Take 1 tablet (250 mg total) by mouth 2 (two) times daily.     levothyroxine 50 MCG tablet  Commonly known as:  SYNTHROID, LEVOTHROID  Take 50 mcg by mouth every morning.     phenytoin 200 MG ER capsule  Commonly known as:  DILANTIN  Take 1 capsule (200 mg total) by mouth daily at 8 pm.     QUEtiapine 25 MG tablet  Commonly known as:  SEROQUEL  Take 1 tablet (25 mg total) by mouth 2 (two) times daily.        No orders of the defined types were placed in this encounter.     There is no immunization history on file for this patient.  History  Substance Use Topics  . Smoking status: Never Smoker   . Smokeless tobacco: Never Used  . Alcohol Use: No    Family history is noncontributory  Review of Systems  OBTAINED FROM : family, nurse GENERAL:  no fevers, +fatigue, pt was up this am doing PT SKIN: No itching, rash  EYES: No eye pain, redness, discharge EARS: No earache, tinnitus, change in hearing NOSE: No congestion, drainage or bleeding  MOUTH/THROAT: No mouth or tooth pain, No sore throat RESPIRATORY: No cough, wheezing, SOB CARDIAC: No chest pain, palpitations, lower extremity edema  GI: No abdominal pain, No N/V/D; +constipation, No heartburn or reflux  GU: No dysuria, frequency or urgency, or incontinence  MUSCULOSKELETAL: No unrelieved bone/joint pain NEUROLOGIC: No headache, dizziness  PSYCHIATRIC: No overt anxiety or sadness, No behavior issue.   Filed Vitals:   11/21/13 2217  BP: 147/79  Pulse: 96  Temp: 98.9 F (37.2 C)  Resp: 20    Physical Exam  GENERAL APPEARANCE: sleepy, awakens to irritant, min conversant,  No acute distress.  SKIN: No diaphoresis rash HEAD: Normocephalic, atraumatic  EYES:  Conjunctiva/lids clear. Pupils round, reactive. EOMs intact.  EARS: External exam WNL, canals clear. Hearing grossly normal.  NOSE: No deformity or discharge.  MOUTH/THROAT: Lips w/o lesions  RESPIRATORY: Breathing is even, unlabored. Lung sounds are clear   CARDIOVASCULAR: Heart RRR no murmurs, rubs or gallops. No peripheral edema.   GASTROINTESTINAL: Abdomen is soft, non-tender, not distended w/ normal bowel sounds. GENITOURINARY: Bladder non tender, not distended  MUSCULOSKELETAL: No abnormal joints or musculature NEUROLOGIC:  Cranial nerves 2-12 grossly intact;mild dysarthria but understandable PSYCHIATRIC:  no behavioral issues  Patient Active Problem List   Diagnosis Date Noted  . Acute encephalopathy 11/27/2013  . Hyperlipidemia   . Septic shock 22-Sep-2013  . HCAP (healthcare-associated pneumonia) 22-Sep-2013  . Acute respiratory failure with hypoxia 22-Sep-2013  . ARF (acute renal failure) 22-Sep-2013  . Hypernatremia 22-Sep-2013  . Acute ischemic stroke 11/17/2013  . Hypothyroidism 11/17/2013  . Seizure disorder 11/17/2013  . History of herpes encephalitis 11/17/2013  . Localization-related (focal) (partial) epilepsy and epileptic syndromes with complex partial seizures, without mention of intractable epilepsy 12/12/2012  . Inguinal hernia without mention of obstruction or gangrene, unilateral or unspecified, (not specified as recurrent) 12/30/2010  . Chronic atrial fibrillation 12/30/2010    CBC    Component Value Date/Time   WBC 12.1* 22-Sep-2013 1455   WBC 5.4 07/02/2013 1509   RBC 5.52 22-Sep-2013 1455   RBC 4.33 07/02/2013 1509   HGB 18.4* 22-Sep-2013 1505   HCT 54.0* 22-Sep-2013 1505   PLT 202 22-Sep-2013 1455   MCV 98.4 22-Sep-2013 1455   LYMPHSABS 0.8 22-Sep-2013 1455   LYMPHSABS 0.8 07/02/2013 1509   MONOABS 1.4* 22-Sep-2013 1455   EOSABS 0.0 22-Sep-2013 1455   EOSABS 0.2 07/02/2013 1509   BASOSABS 0.0 22-Sep-2013 1455   BASOSABS 0.0 07/02/2013 1509    CMP     Component  Value Date/Time   NA 155* 22-Sep-2013 1505   NA 146* 07/02/2013 1509   K 5.0 22-Sep-2013 1505   CL 119* 22-Sep-2013 1505   CO2 25 22-Sep-2013 1455   GLUCOSE 147* 22-Sep-2013 1505   GLUCOSE 92 07/02/2013 1509   BUN 85* 22-Sep-2013 1505   BUN 19 07/02/2013 1509   CREATININE 2.90* 22-Sep-2013 1505   CALCIUM 8.4 22-Sep-2013 1455   PROT 7.4 22-Sep-2013 1455   PROT 6.0 07/02/2013 1509   ALBUMIN 3.6 22-Sep-2013 1455   AST 42* 22-Sep-2013 1455   ALT 23 22-Sep-2013 1455   ALKPHOS 101 22-Sep-2013 1455   BILITOT 0.4 22-Sep-2013 1455   GFRNONAA 18* 22-Sep-2013 1455   GFRAA 20* 22-Sep-2013 1455  Assessment and Plan  Acute ischemic stroke Patient presented with complaints of dysarthria and right-sided weakness, MRI of the brain showed a 1 cm acute infarction within the left lateral thalamus. Continue aspirin 325 mg by mouth daily. Patient has history of atrial fibrillation and was not on any progression to prior concern of falls. Dr. Vanessa BarbaraZamora spoke with Dr. CT from stroke service who did not recommend anticoagulation at this point. Patient was placed on low-dose statin. PT evaluation recommended skilled nursing facility. Speech pathology recommended regular diet with thin liquids.   Hyperlipidemia Fasting panel showing LDL of 86, continue low-dose Lipitor    Chronic atrial fibrillation Currently rate controlled. Patient had not been on anticoagulation due to recurrent falls, it is possible acute CVA may have resulted from thromboembolic phenomenon. Neurology did not recommend starting anticoagulation at this point   Seizure disorder Continue Keppra 250 mg twice a day and Dilantin 200 mg PO q daily   Hypothyroidism Continue synthroid 50 mcg  Acute encephalopathy Improving likely due to #1 and sundowning, CT head and MRI showed atrophy and small vessel disease, likely underlying dementia. Continue Seroquel.     Margit HanksALEXANDER, Bree Heinzelman D, MD

## 2013-11-24 ENCOUNTER — Encounter (HOSPITAL_COMMUNITY): Payer: Self-pay | Admitting: Emergency Medicine

## 2013-11-24 ENCOUNTER — Emergency Department (HOSPITAL_COMMUNITY): Payer: Medicare Other

## 2013-11-24 ENCOUNTER — Inpatient Hospital Stay (HOSPITAL_COMMUNITY)
Admission: EM | Admit: 2013-11-24 | Discharge: 2013-12-01 | DRG: 871 | Disposition: E | Payer: Medicare Other | Attending: Internal Medicine | Admitting: Internal Medicine

## 2013-11-24 DIAGNOSIS — R6520 Severe sepsis without septic shock: Secondary | ICD-10-CM

## 2013-11-24 DIAGNOSIS — J9601 Acute respiratory failure with hypoxia: Secondary | ICD-10-CM | POA: Diagnosis present

## 2013-11-24 DIAGNOSIS — Z881 Allergy status to other antibiotic agents status: Secondary | ICD-10-CM | POA: Diagnosis not present

## 2013-11-24 DIAGNOSIS — Z96641 Presence of right artificial hip joint: Secondary | ICD-10-CM | POA: Diagnosis present

## 2013-11-24 DIAGNOSIS — I69351 Hemiplegia and hemiparesis following cerebral infarction affecting right dominant side: Secondary | ICD-10-CM

## 2013-11-24 DIAGNOSIS — R531 Weakness: Secondary | ICD-10-CM

## 2013-11-24 DIAGNOSIS — E870 Hyperosmolality and hypernatremia: Secondary | ICD-10-CM | POA: Diagnosis present

## 2013-11-24 DIAGNOSIS — A419 Sepsis, unspecified organism: Principal | ICD-10-CM | POA: Diagnosis present

## 2013-11-24 DIAGNOSIS — E039 Hypothyroidism, unspecified: Secondary | ICD-10-CM | POA: Diagnosis present

## 2013-11-24 DIAGNOSIS — N179 Acute kidney failure, unspecified: Secondary | ICD-10-CM

## 2013-11-24 DIAGNOSIS — J189 Pneumonia, unspecified organism: Secondary | ICD-10-CM | POA: Diagnosis present

## 2013-11-24 DIAGNOSIS — Z515 Encounter for palliative care: Secondary | ICD-10-CM

## 2013-11-24 DIAGNOSIS — I6350 Cerebral infarction due to unspecified occlusion or stenosis of unspecified cerebral artery: Secondary | ICD-10-CM

## 2013-11-24 DIAGNOSIS — Z66 Do not resuscitate: Secondary | ICD-10-CM | POA: Diagnosis present

## 2013-11-24 DIAGNOSIS — R4701 Aphasia: Secondary | ICD-10-CM | POA: Diagnosis present

## 2013-11-24 DIAGNOSIS — Z79899 Other long term (current) drug therapy: Secondary | ICD-10-CM

## 2013-11-24 DIAGNOSIS — Y95 Nosocomial condition: Secondary | ICD-10-CM | POA: Diagnosis present

## 2013-11-24 DIAGNOSIS — I639 Cerebral infarction, unspecified: Secondary | ICD-10-CM | POA: Diagnosis present

## 2013-11-24 DIAGNOSIS — I482 Chronic atrial fibrillation, unspecified: Secondary | ICD-10-CM | POA: Diagnosis present

## 2013-11-24 DIAGNOSIS — G40909 Epilepsy, unspecified, not intractable, without status epilepticus: Secondary | ICD-10-CM | POA: Diagnosis present

## 2013-11-24 DIAGNOSIS — Z7982 Long term (current) use of aspirin: Secondary | ICD-10-CM | POA: Diagnosis not present

## 2013-11-24 DIAGNOSIS — R000 Tachycardia, unspecified: Secondary | ICD-10-CM | POA: Diagnosis present

## 2013-11-24 DIAGNOSIS — R6521 Severe sepsis with septic shock: Secondary | ICD-10-CM | POA: Diagnosis present

## 2013-11-24 LAB — I-STAT CHEM 8, ED
BUN: 85 mg/dL — ABNORMAL HIGH (ref 6–23)
CALCIUM ION: 0.96 mmol/L — AB (ref 1.13–1.30)
CHLORIDE: 119 meq/L — AB (ref 96–112)
CREATININE: 2.9 mg/dL — AB (ref 0.50–1.35)
GLUCOSE: 147 mg/dL — AB (ref 70–99)
HCT: 54 % — ABNORMAL HIGH (ref 39.0–52.0)
Hemoglobin: 18.4 g/dL — ABNORMAL HIGH (ref 13.0–17.0)
Potassium: 5 mEq/L (ref 3.7–5.3)
Sodium: 155 mEq/L — ABNORMAL HIGH (ref 137–147)
TCO2: 26 mmol/L (ref 0–100)

## 2013-11-24 LAB — CBC WITH DIFFERENTIAL/PLATELET
Basophils Absolute: 0 10*3/uL (ref 0.0–0.1)
Basophils Relative: 0 % (ref 0–1)
Eosinophils Absolute: 0 10*3/uL (ref 0.0–0.7)
Eosinophils Relative: 0 % (ref 0–5)
HEMATOCRIT: 54.3 % — AB (ref 39.0–52.0)
Hemoglobin: 17.9 g/dL — ABNORMAL HIGH (ref 13.0–17.0)
LYMPHS ABS: 0.8 10*3/uL (ref 0.7–4.0)
LYMPHS PCT: 6 % — AB (ref 12–46)
MCH: 32.4 pg (ref 26.0–34.0)
MCHC: 33 g/dL (ref 30.0–36.0)
MCV: 98.4 fL (ref 78.0–100.0)
Monocytes Absolute: 1.4 10*3/uL — ABNORMAL HIGH (ref 0.1–1.0)
Monocytes Relative: 12 % (ref 3–12)
NEUTROS ABS: 9.9 10*3/uL — AB (ref 1.7–7.7)
Neutrophils Relative %: 82 % — ABNORMAL HIGH (ref 43–77)
Platelets: 202 10*3/uL (ref 150–400)
RBC: 5.52 MIL/uL (ref 4.22–5.81)
RDW: 13.3 % (ref 11.5–15.5)
WBC: 12.1 10*3/uL — AB (ref 4.0–10.5)

## 2013-11-24 LAB — COMPREHENSIVE METABOLIC PANEL
ALK PHOS: 101 U/L (ref 39–117)
ALT: 23 U/L (ref 0–53)
AST: 42 U/L — ABNORMAL HIGH (ref 0–37)
Albumin: 3.6 g/dL (ref 3.5–5.2)
Anion gap: 19 — ABNORMAL HIGH (ref 5–15)
BILIRUBIN TOTAL: 0.4 mg/dL (ref 0.3–1.2)
BUN: 78 mg/dL — ABNORMAL HIGH (ref 6–23)
CHLORIDE: 114 meq/L — AB (ref 96–112)
CO2: 25 meq/L (ref 19–32)
Calcium: 8.4 mg/dL (ref 8.4–10.5)
Creatinine, Ser: 2.94 mg/dL — ABNORMAL HIGH (ref 0.50–1.35)
GFR calc Af Amer: 20 mL/min — ABNORMAL LOW (ref >90)
GFR, EST NON AFRICAN AMERICAN: 18 mL/min — AB (ref >90)
GLUCOSE: 145 mg/dL — AB (ref 70–99)
Potassium: 5.1 mEq/L (ref 3.7–5.3)
SODIUM: 158 meq/L — AB (ref 137–147)
Total Protein: 7.4 g/dL (ref 6.0–8.3)

## 2013-11-24 LAB — I-STAT CG4 LACTIC ACID, ED: Lactic Acid, Venous: 2.07 mmol/L (ref 0.5–2.2)

## 2013-11-24 LAB — PHENYTOIN LEVEL, TOTAL: Phenytoin Lvl: 5.5 ug/mL — ABNORMAL LOW (ref 10.0–20.0)

## 2013-11-24 MED ORDER — DEXTROSE 5 % IV SOLN
1.0000 g | INTRAVENOUS | Status: DC
  Administered 2013-11-24: 1 g via INTRAVENOUS
  Filled 2013-11-24: qty 1

## 2013-11-24 MED ORDER — LORAZEPAM 2 MG/ML IJ SOLN: 1.0000 mg | INTRAMUSCULAR | Status: DC | PRN

## 2013-11-24 MED ORDER — MORPHINE SULFATE 2 MG/ML IJ SOLN
2.0000 mg | Freq: Once | INTRAMUSCULAR | Status: DC
  Filled 2013-11-24: qty 1

## 2013-11-24 MED ORDER — MORPHINE SULFATE 2 MG/ML IJ SOLN
2.0000 mg | INTRAMUSCULAR | Status: DC | PRN
  Administered 2013-11-24: 2 mg via INTRAVENOUS

## 2013-11-24 MED ORDER — SODIUM CHLORIDE 0.9 % IV SOLN
1000.0000 mg | INTRAVENOUS | Status: DC
  Filled 2013-11-24: qty 20

## 2013-11-24 MED ORDER — SODIUM CHLORIDE 0.9 % IV BOLUS (SEPSIS)
1000.0000 mL | Freq: Once | INTRAVENOUS | Status: AC
  Administered 2013-11-24: 1000 mL via INTRAVENOUS

## 2013-11-24 MED ORDER — SODIUM CHLORIDE 0.9 % IV SOLN
2.0000 mg/h | INTRAVENOUS | Status: DC
  Administered 2013-11-24: 2 mg/h via INTRAVENOUS
  Filled 2013-11-24: qty 10

## 2013-11-24 MED ORDER — PHENYTOIN SODIUM 50 MG/ML IJ SOLN
100.0000 mg | Freq: Three times a day (TID) | INTRAMUSCULAR | Status: DC
  Filled 2013-11-24: qty 2

## 2013-11-27 ENCOUNTER — Encounter: Payer: Self-pay | Admitting: Internal Medicine

## 2013-11-27 DIAGNOSIS — G9340 Encephalopathy, unspecified: Secondary | ICD-10-CM | POA: Insufficient documentation

## 2013-11-27 DIAGNOSIS — E785 Hyperlipidemia, unspecified: Secondary | ICD-10-CM | POA: Insufficient documentation

## 2013-11-27 NOTE — Assessment & Plan Note (Signed)
Continue synthroid 50 mcg 

## 2013-11-27 NOTE — Assessment & Plan Note (Signed)
Currently rate controlled. Patient had not been on anticoagulation due to recurrent falls, it is possible acute CVA may have resulted from thromboembolic phenomenon. Neurology did not recommend starting anticoagulation at this point

## 2013-11-27 NOTE — Assessment & Plan Note (Signed)
Patient presented with complaints of dysarthria and right-sided weakness, MRI of the brain showed a 1 cm acute infarction within the left lateral thalamus. Continue aspirin 325 mg by mouth daily. Patient has history of atrial fibrillation and was not on any progression to prior concern of falls. Dr. Vanessa BarbaraZamora spoke with Dr. CT from stroke service who did not recommend anticoagulation at this point. Patient was placed on low-dose statin. PT evaluation recommended skilled nursing facility. Speech pathology recommended regular diet with thin liquids.

## 2013-11-27 NOTE — Assessment & Plan Note (Signed)
Improving likely due to #1 and sundowning, CT head and MRI showed atrophy and small vessel disease, likely underlying dementia. Continue Seroquel.

## 2013-11-27 NOTE — Assessment & Plan Note (Signed)
Fasting panel showing LDL of 86, continue low-dose Lipitor

## 2013-11-27 NOTE — Assessment & Plan Note (Signed)
Continue Keppra 250 mg twice a day and Dilantin 200 mg PO q daily

## 2013-12-01 LAB — CULTURE, BLOOD (ROUTINE X 2)
CULTURE: NO GROWTH
Culture: NO GROWTH

## 2013-12-01 NOTE — ED Notes (Signed)
Patient returned from CT

## 2013-12-01 NOTE — Consult Note (Addendum)
Referring Physician: Dr. Alvino Chapel.    Chief Complaint: altered mental status, right hemiplegia  HPI:                                                                                                                                         Ronald Roberts is an 80 y.o. male with a past medical history significant for herpes simplex encephalitis complicated by focal seizures with impairment of consciousness, atrial fibrillation, stroke with residual right sided weakness and expressive aphasia, brought in via EMS due to the above stated symptoms. Patient is in respiratory distress, unresponsive, can not provide clinical information, and thus all history is obtained form the chart and family. According to family, patient has been in a rehab facility and awoke at 0300 to use the bathroom and wife states he was having some trouble at that point, patient went back to bed and woke this am with more right side weakness. Upon arrival to the ED patient lethargic and noted to have dense right hemiplegia. Up to 88% on NRB mask at this time. CT brain suggestive of subacute lacunar infarct in the left thalamus.  Mild leukocytosis. Na 158, Cr 2.94. Dilantin level 5.5. UA pending. Date last known well: uncertain Time last known well: uncertain tPA Given:  No, late presentation   Past Medical History  Diagnosis Date  . Arthritis   . Heart murmur   . Hearing loss   . Blood in urine   . 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 Confusion   . Memory loss   . Aphasia   . Easy bruising   . Seizures     last seizure was   . Nocturia   . Hypothyroidism   . Snores   . Atrial fibrillation   . Localization-related (focal) (partial) epilepsy and epileptic syndromes with complex partial seizures, without mention of intractable epilepsy 12/12/2012  . Hypersomnia, unspecified   . Hypothyroidism     Past Surgical History  Procedure Laterality Date  . Prostate surgery  [phone] and  2006  . Joint replacement  1995    right hip replacement   . Inguinal hernia repair  12/28/2010    Procedure: HERNIA REPAIR INGUINAL ADULT;  Surgeon: Willey Blade, MD;  Location: [facility];  Service: General;  Laterality: Left;  With Mesh  . Cataract extraction Bilateral     Family History  Problem Relation Age of Onset  . Stroke Mother    Social History:  reports that he has never smoked. He has never used smokeless tobacco. He reports that he does not drink alcohol or use illicit drugs.  Allergies:  Allergies  Allergen Reactions  . Vancomycin Rash    Medications:  I have reviewed the patient's current medications.  ROS: unable to obtain due to mental status.                                                                                                         History obtained from chart review and family   Physical exam: acutely ill, unresponsive, in respiratory distress. Blood pressure 125/59, pulse 119, temperature 104.5 F (40.3 C), temperature source Rectal, resp. rate 40, height $RemoveBe'5\' 5"'OcxGYkAjX$  (1.651 m), weight 79.379 kg (175 lb), SpO2 87.00%. Head: normocephalic. Neck: supple, no bruits, no JVD. Cardiac: no murmurs. Lungs: clear. Abdomen: soft, no tender, no mass. Extremities: no edema. Neurologic Examination:                                                                                                      Mental status: unresponsive. CN 2-12: asymmetric pupils left larger than right. No gaze preference. Corneal reflexes present.  Motor: no spontaneous movements. Sensory: no reaction to pain. DTR's: 1 all over. Plantars: down. Coordination and gait: unable to test  Results for orders placed during the hospital encounter of 2013/11/29 (from the past 48 hour(s))  CBC WITH DIFFERENTIAL     Status: Abnormal   Collection Time    11/29/2013  2:55 PM       Result Value Ref Range   WBC 12.1 (*) 4.0 - 10.5 K/uL   RBC 5.52  4.22 - 5.81 MIL/uL   Hemoglobin 17.9 (*) 13.0 - 17.0 g/dL   HCT 54.3 (*) 39.0 - 52.0 %   MCV 98.4  78.0 - 100.0 fL   MCH 32.4  26.0 - 34.0 pg   MCHC 33.0  30.0 - 36.0 g/dL   RDW 13.3  11.5 - 15.5 %   Platelets 202  150 - 400 K/uL   Neutrophils Relative % 82 (*) 43 - 77 %   Neutro Abs 9.9 (*) 1.7 - 7.7 K/uL   Lymphocytes Relative 6 (*) 12 - 46 %   Lymphs Abs 0.8  0.7 - 4.0 K/uL   Monocytes Relative 12  3 - 12 %   Monocytes Absolute 1.4 (*) 0.1 - 1.0 K/uL   Eosinophils Relative 0  0 - 5 %   Eosinophils Absolute 0.0  0.0 - 0.7 K/uL   Basophils Relative 0  0 - 1 %   Basophils Absolute 0.0  0.0 - 0.1 K/uL  COMPREHENSIVE METABOLIC PANEL     Status: Abnormal (Preliminary result)   Collection Time    November 29, 2013  2:55 PM      Result Value Ref Range   Sodium 158 (*) 137 - 147 mEq/L  Potassium PENDING  3.7 - 5.3 mEq/L   Chloride 114 (*) 96 - 112 mEq/L   CO2 25  19 - 32 mEq/L   Glucose, Bld 145 (*) 70 - 99 mg/dL   BUN 78 (*) 6 - 23 mg/dL   Creatinine, Ser 2.94 (*) 0.50 - 1.35 mg/dL   Calcium 8.4  8.4 - 10.5 mg/dL   Total Protein 7.4  6.0 - 8.3 g/dL   Albumin 3.6  3.5 - 5.2 g/dL   AST PENDING  0 - 37 U/L   ALT PENDING  0 - 53 U/L   Alkaline Phosphatase 101  39 - 117 U/L   Total Bilirubin 0.4  0.3 - 1.2 mg/dL   GFR calc non Af Amer 18 (*) >90 mL/min   GFR calc Af Amer 20 (*) >90 mL/min   Comment: (NOTE)     The eGFR has been calculated using the CKD EPI equation.     This calculation has not been validated in all clinical situations.     eGFR's persistently <90 mL/min signify possible Chronic Kidney     Disease.   Anion gap 19 (*) 5 - 15  PHENYTOIN LEVEL, TOTAL     Status: Abnormal   Collection Time    12-19-13  2:55 PM      Result Value Ref Range   Phenytoin Lvl 5.5 (*) 10.0 - 20.0 ug/mL  I-STAT CHEM 8, ED     Status: Abnormal   Collection Time    12-19-13  3:05 PM      Result Value Ref Range   Sodium 155 (*)  137 - 147 mEq/L   Potassium 5.0  3.7 - 5.3 mEq/L   Chloride 119 (*) 96 - 112 mEq/L   BUN 85 (*) 6 - 23 mg/dL   Creatinine, Ser 2.90 (*) 0.50 - 1.35 mg/dL   Glucose, Bld 147 (*) 70 - 99 mg/dL   Calcium, Ion 0.96 (*) 1.13 - 1.30 mmol/L   TCO2 26  0 - 100 mmol/L   Hemoglobin 18.4 (*) 13.0 - 17.0 g/dL   HCT 54.0 (*) 39.0 - 52.0 %   Dg Chest Port 1 View  12-19-2013   CLINICAL DATA:  Chest congestion. Labored breathing. Lethargy. Tachypnea. Decreased oxygen saturations.  EXAM: PORTABLE CHEST - 1 VIEW  COMPARISON:  12/20/2010.  FINDINGS: Airspace opacities noted in the right lung base consistent with pneumonia.  Lungs are otherwise clear. Cardiac silhouette is mildly enlarged. No mediastinal or hilar masses. No convincing pleural effusion and no pneumothorax.  Bony thorax is demineralized but grossly intact.  IMPRESSION: Right lower lobe airspace opacity consistent with pneumonia given the history.   Electronically Signed   By: Lajean Manes M.D.   On: Dec 19, 2013 14:52     Assessment: 80 y.o. male brought in due to altered mental status and worsening right HP. CT brain suggestive of subacute lacunar infarct in the left thalamus which will not be responsible for patient's presentation Hypernatremia and elevated creatinine. Mild leukocytosis. Differential include new left brian stroke not visualized on CT, seizure with Todd's paralysis, NCSE, worsening of previous deficits in the context of infection and metabolic encephalopathy. Dilantin level is low and will give him a loading dose 1 gram IV dilantin. EEG. Will need MRI brain. Admit to medicine. Will follow up.   Dorian Pod ,MD [facility] [phone]  12-19-2013, 3:52 PM

## 2013-12-01 NOTE — ED Provider Notes (Signed)
CSN: [id number]     Arrival date & time May 09, 2013  1412 History   First MD Initiated Contact with Patient May 09, 2013 1413     Chief Complaint  Patient presents with  . Respiratory Distress    Level V Due to altered mental status. (Consider location/radiation/quality/duration/timing/severity/associated sxs/prior Treatment) HPI Patient presents with altered level status and hypoxia. Comes from a nursing home. Reportedly has had decreased mental status over the last few days. Had recently been inpatient for stroke. He had some right-sided weakness. Initial sats in the 70s and 80s. Up to upper 80s on nonrebreather. Patient is a DO NOT RESUSCITATE. Mouth is very dry. He has been less combative than usual over the last few days. Past Medical History  Diagnosis Date  . Arthritis   . Heart murmur   . Hearing loss   . Blood in urine   . 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 Confusion   . Memory loss   . Aphasia   . Easy bruising   . Seizures     last seizure was   . Nocturia   . Hypothyroidism   . Snores   . Atrial fibrillation   . Localization-related (focal) (partial) epilepsy and epileptic syndromes with complex partial seizures, without mention of intractable epilepsy 12/12/2012  . Hypersomnia, unspecified   . Hypothyroidism    Past Surgical History  Procedure Laterality Date  . Prostate surgery  [phone] and 2006  . Joint replacement  1995    right hip replacement   . Inguinal hernia repair  12/28/2010    Procedure: HERNIA REPAIR INGUINAL ADULT;  Surgeon: Iona CoachWilliam J Weatherly, MD;  Location: [facility];  Service: General;  Laterality: Left;  With Mesh  . Cataract extraction Bilateral    Family History  Problem Relation Age of Onset  . Stroke Mother    History  Substance Use Topics  . Smoking status: Never Smoker   . Smokeless tobacco: Never Used  . Alcohol Use: No    Review of Systems  Unable to perform ROS     Allergies   Vancomycin  Home Medications   Prior to Admission medications   Medication Sig Start Date End Date Taking? Authorizing Provider  aspirin 325 MG tablet Take 1 tablet (325 mg total) by mouth daily. 11/20/13  Yes Ripudeep Jenna LuoK Rai, MD  atorvastatin (LIPITOR) 20 MG tablet Take 1 tablet (20 mg total) by mouth at bedtime. 11/20/13  Yes Ripudeep Jenna LuoK Rai, MD  cholecalciferol (VITAMIN D) 1000 UNITS tablet Take 1,000 Units by mouth daily.   Yes Historical Provider, MD  levETIRAcetam (KEPPRA) 250 MG tablet Take 1 tablet (250 mg total) by mouth 2 (two) times daily. 11/20/13  Yes Ripudeep Jenna LuoK Rai, MD  levothyroxine (SYNTHROID, LEVOTHROID) 50 MCG tablet Take 50 mcg by mouth every morning.  09/11/10  Yes Historical Provider, MD  nystatin (MYCOSTATIN) 100000 UNIT/ML suspension Take 10 mLs by mouth 4 (four) times daily.   Yes Historical Provider, MD  phenytoin (DILANTIN) 100 MG ER capsule Take 200 mg by mouth once.   Yes Historical Provider, MD  QUEtiapine (SEROQUEL) 25 MG tablet Take 1 tablet (25 mg total) by mouth 2 (two) times daily. 11/20/13  Yes Ripudeep K Rai, MD   BP 130/98  Pulse 103  Temp(Src) 104.5 F (40.3 C) (Rectal)  Resp 32  Ht 5\' 5"  (1.651 m)  Wt 175 lb (79.379 kg)  BMI 29.12 kg/m2  SpO2 72% Physical Exam  Constitutional: He appears well-developed.  HENT:  Mouth is very dry. Some snoring respirations  Eyes:  Pupils somewhat reactive, left pupil larger than right.  Cardiovascular:  Tachycardia  Pulmonary/Chest:  Harsh breath sounds diffusely, some transmitted upper airways.  Abdominal: There is no tenderness.  Musculoskeletal:  No deformity to extremities  Neurological:  Patient is minimally responsive. Will withdraw from pain on left but will not follow commands. Nonverbal. No withdraw from pain on the right. Pupils are unequal with left being larger. Minimal gag reflex  Skin: Skin is warm.    ED Course  Procedures (including critical care time) Labs Review Labs Reviewed  CBC  WITH DIFFERENTIAL - Abnormal; Notable for the following:    WBC 12.1 (*)    Hemoglobin 17.9 (*)    HCT 54.3 (*)    Neutrophils Relative % 82 (*)    Neutro Abs 9.9 (*)    Lymphocytes Relative 6 (*)    Monocytes Absolute 1.4 (*)    All other components within normal limits  COMPREHENSIVE METABOLIC PANEL - Abnormal; Notable for the following:    Sodium 158 (*)    Chloride 114 (*)    Glucose, Bld 145 (*)    BUN 78 (*)    Creatinine, Ser 2.94 (*)    AST 42 (*)    GFR calc non Af Amer 18 (*)    GFR calc Af Amer 20 (*)    Anion gap 19 (*)    All other components within normal limits  PHENYTOIN LEVEL, TOTAL - Abnormal; Notable for the following:    Phenytoin Lvl 5.5 (*)    All other components within normal limits  I-STAT CHEM 8, ED - Abnormal; Notable for the following:    Sodium 155 (*)    Chloride 119 (*)    BUN 85 (*)    Creatinine, Ser 2.90 (*)    Glucose, Bld 147 (*)    Calcium, Ion 0.96 (*)    Hemoglobin 18.4 (*)    HCT 54.0 (*)    All other components within normal limits  CULTURE, BLOOD (ROUTINE X 2)  CULTURE, BLOOD (ROUTINE X 2)  URINE CULTURE  URINALYSIS, ROUTINE W REFLEX MICROSCOPIC  I-STAT CG4 LACTIC ACID, ED  I-STAT CHEM 8, ED    Imaging Review Ct Head Wo Contrast  2013/12/08   CLINICAL DATA:  Increasing lethargy, weakness, history of seizures  EXAM: CT HEAD WITHOUT CONTRAST  TECHNIQUE: Contiguous axial images were obtained from the base of the skull through the vertex without intravenous contrast.  COMPARISON:  MRI brain and CT head dated 11/17/2013  FINDINGS: Motion degraded images.  No evidence of parenchymal hemorrhage or extra-axial fluid collection.  No mass lesion, mass effect, or midline shift.  Subacute lacunar infarct in the left thalamus (series 2/image 15), corresponding to MRI abnormality.  Encephalomalacic changes in the left temporal lobe/middle cranial fossa.  Subcortical white matter and periventricular small vessel ischemic changes.  Global cortical  and central atrophy with secondary ventricular prominence.  The visualized paranasal sinuses are essentially clear. The mastoid air cells are unopacified.  No evidence of calvarial fracture.  IMPRESSION: Subacute lacunar infarct in the left thalamus.  Encephalomalacic changes in the left temporal lobe related to prior infarction.  Atrophy with small vessel ischemic changes.   Electronically Signed   By: Charline Bills M.D.   On: 08-Dec-2013 16:04   Dg Chest Port 1 View  08-Dec-2013   CLINICAL DATA:  Chest congestion. Labored breathing. Lethargy. Tachypnea. Decreased  oxygen saturations.  EXAM: PORTABLE CHEST - 1 VIEW  COMPARISON:  12/20/2010.  FINDINGS: Airspace opacities noted in the right lung base consistent with pneumonia.  Lungs are otherwise clear. Cardiac silhouette is mildly enlarged. No mediastinal or hilar masses. No convincing pleural effusion and no pneumothorax.  Bony thorax is demineralized but grossly intact.  IMPRESSION: Right lower lobe airspace opacity consistent with pneumonia given the history.   Electronically Signed   By: Amie Portlandavid  Ormond M.D.   On: 02/25/2013 14:52     EKG Interpretation None      MDM   Final diagnoses:  Weakness  Severe sepsis  HCAP (healthcare-associated pneumonia)  Acute respiratory failure with hypoxia    Patient with a severe sepsis. Would have been intubated immediately upon arrival, however the patient is a DO NOT RESUSCITATE. Minimal gag reflex and hypoxic. He is septic with pneumonia as a likely source. Creatinine is increased, he has had recent stroke and the question was could there be another stroke. CT scan was ordered and neurology was counseled. Also there was a question of  noncontractile seizure activity. IV cefepime given. Patient has a very poor prognosis. Family was informed of this and this likely could be a terminal event. Internal medicine will also discuss goals of care.  CRITICAL CARE Performed by: Billee CashingPICKERING,Nyree Yonker R. Total  critical care time: 30 Critical care time was exclusive of separately billable procedures and treating other patients. Critical care was necessary to treat or prevent imminent or life-threatening deterioration. Critical care was time spent personally by me on the following activities: development of treatment plan with patient and/or surrogate as well as nursing, discussions with consultants, evaluation of patient's response to treatment, examination of patient, obtaining history from patient or surrogate, ordering and performing treatments and interventions, ordering and review of laboratory studies, ordering and review of radiographic studies, pulse oximetry and re-evaluation of patient's condition.  Juliet RudeNathan R. Rubin PayorPickering, MD 07-03-13 [phone]

## 2013-12-01 NOTE — H&P (Addendum)
PATIENT DETAILS Name: Ronald Roberts Age: 80 y.o. Sex: male Date of Birth: 1922-09-26 Admit Date: November 20, 2013 UJW:[id number]:Roberts, Vernie AmmonsONALD WAYNE, MD   CHIEF COMPLAINT:  Decreased responsiveness, respiratory distress-this morning  HPI: Quita SkyeWilliam S Roberts is a 80 y.o. male with a Past Medical History of recent CVA with expressive aphasia and right-sided weakness just discharged to a local skilled nursing facility, history of focal seizures, atrial fibrillation, history of herpes encephalitis resultant aphasia/memory loss who presents today with the above noted complaint. Apparently prior to patient having his recent CVA, very active and very functional. Unfortunately, post CVA, patient had significant decline in his functionality. He was discharged to a local skilled nursing facility, where the past few days patient had significant oral intake and was much more lethargic and sleeping all the time.Spouse claims that, patient used to cough after eating. This morning, he had significant worsening of his mental status was essentially unresponsive, he was also noted to be in respiratory distress. Reportedly, he also had worsening of his right-sided weakness. Initial O2 saturation when the 70s, he was brought to the ED where he was found to be febrile, hypoxic, hypotensive. Patient was a DO NOT RESUSCITATE, hence not intubated, I was asked to admit this patient for further evaluation. EDMD, had started patient on IV fluids, IV vancomycin and cefepime.  I had a long discussion with patient's spouse, daughter and son at bedside. Patient was exhibiting agonal respirations, unresponsive, and persistently hypotensive. It was explained clearly to the family that only chance of patient surviving this critical illness was through intubation, and other invasive measures.However, it was felt that his quality of life would unlikely improve and be still very poor. Family was clear that the patient would not want any  aggressive measures, apparently patient was on a ventilator for 35 days in the past and ended up with the tracheotomy. Subsequently, we discussed concept of comfort care as the patient was clearly dying. I explained to the family that in my medical opinion, patient would not survive with just IV fluids and IV antibiotics. Subsequently we decided to transition to full comfort measures.  Note:history obtained after speaking with family, patient unresponsive and not able to participate.   ALLERGIES:   Allergies  Allergen Reactions  . Vancomycin Rash    PAST MEDICAL HISTORY: Past Medical History  Diagnosis Date  . Arthritis   . Heart murmur   . Hearing loss   . Blood in urine   . 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 Confusion   . Memory loss   . Aphasia   . Easy bruising   . Seizures     last seizure was   . Nocturia   . Hypothyroidism   . Snores   . Atrial fibrillation   . Localization-related (focal) (partial) epilepsy and epileptic syndromes with complex partial seizures, without mention of intractable epilepsy 12/12/2012  . Hypersomnia, unspecified   . Hypothyroidism     PAST SURGICAL HISTORY: Past Surgical History  Procedure Laterality Date  . Prostate surgery  [id number] and 2006  . Joint replacement  1995    right hip replacement   . Inguinal hernia repair  12/28/2010    Procedure: HERNIA REPAIR INGUINAL ADULT;  Surgeon: Iona CoachWilliam J Weatherly, MD;  Location: [facility];  Service: General;  Laterality: Left;  With Mesh  . Cataract extraction Bilateral     MEDICATIONS AT HOME: Prior to Admission  medications   Medication Sig Start Date End Date Taking? Authorizing Provider  aspirin 325 MG tablet Take 1 tablet (325 mg total) by mouth daily. 11/20/13  Yes Ripudeep Jenna LuoK Rai, MD  atorvastatin (LIPITOR) 20 MG tablet Take 1 tablet (20 mg total) by mouth at bedtime. 11/20/13  Yes Ripudeep Jenna LuoK Rai, MD  cholecalciferol (VITAMIN D) 1000 UNITS tablet Take  1,000 Units by mouth daily.   Yes Historical Provider, MD  levETIRAcetam (KEPPRA) 250 MG tablet Take 1 tablet (250 mg total) by mouth 2 (two) times daily. 11/20/13  Yes Ripudeep Jenna LuoK Rai, MD  levothyroxine (SYNTHROID, LEVOTHROID) 50 MCG tablet Take 50 mcg by mouth every morning.  09/11/10  Yes Historical Provider, MD  nystatin (MYCOSTATIN) 100000 UNIT/ML suspension Take 10 mLs by mouth 4 (four) times daily.   Yes Historical Provider, MD  phenytoin (DILANTIN) 100 MG ER capsule Take 200 mg by mouth once.   Yes Historical Provider, MD  QUEtiapine (SEROQUEL) 25 MG tablet Take 1 tablet (25 mg total) by mouth 2 (two) times daily. 11/20/13  Yes Ripudeep Jenna LuoK Rai, MD    FAMILY HISTORY: Family History  Problem Relation Age of Onset  . Stroke Mother     SOCIAL HISTORY:  reports that he has never smoked. He has never used smokeless tobacco. He reports that he does not drink alcohol or use illicit drugs.  REVIEW OF SYSTEMS: Unable to obtain-patient unresponsive  PHYSICAL EXAM: Blood pressure 79/42, pulse 101, temperature 104.5 F (40.3 C), temperature source Rectal, resp. rate 34, height 5\' 5"  (1.651 m), weight 79.379 kg (175 lb), SpO2 77.00%.  General appearance : Unresponsive, using accessory muscles in acute distress. Agonal breathing Chest: Coarse breath sounds, significantly transmitted upper airway sounds. CVS: S1 S2 regular, tachycardic Abdomen: Bowel sounds present, Non tender and not distended with no gaurding, rigidity or rebound. Extremities: B/L Lower Ext shows no edema, both legs are warm to touch Neurology: Unresponsive, no spontaneous movement, does not withdraw to pain. Skin:No Rash Wounds:N/A  LABS ON ADMISSION:   Recent Labs  December 05, 2013 1455 December 05, 2013 1505  NA 158* 155*  K 5.1 5.0  CL 114* 119*  CO2 25  --   GLUCOSE 145* 147*  BUN 78* 85*  CREATININE 2.94* 2.90*  CALCIUM 8.4  --     Recent Labs  December 05, 2013 1455  AST 42*  ALT 23  ALKPHOS 101  BILITOT 0.4  PROT 7.4    ALBUMIN 3.6   No results found for this basename: LIPASE, AMYLASE,  in the last 72 hours  Recent Labs  December 05, 2013 1455 December 05, 2013 1505  WBC 12.1*  --   NEUTROABS 9.9*  --   HGB 17.9* 18.4*  HCT 54.3* 54.0*  MCV 98.4  --   PLT 202  --    No results found for this basename: CKTOTAL, CKMB, CKMBINDEX, TROPONINI,  in the last 72 hours No results found for this basename: DDIMER,  in the last 72 hours No components found with this basename: POCBNP,    RADIOLOGIC STUDIES ON ADMISSION: Ct Head Wo Contrast  Mar 24, 2013   CLINICAL DATA:  Increasing lethargy, weakness, history of seizures  EXAM: CT HEAD WITHOUT CONTRAST  TECHNIQUE: Contiguous axial images were obtained from the base of the skull through the vertex without intravenous contrast.  COMPARISON:  MRI brain and CT head dated 11/17/2013  FINDINGS: Motion degraded images.  No evidence of parenchymal hemorrhage or extra-axial fluid collection.  No mass lesion, mass effect, or midline shift.  Subacute lacunar infarct  in the left thalamus (series 2/image 15), corresponding to MRI abnormality.  Encephalomalacic changes in the left temporal lobe/middle cranial fossa.  Subcortical white matter and periventricular small vessel ischemic changes.  Global cortical and central atrophy with secondary ventricular prominence.  The visualized paranasal sinuses are essentially clear. The mastoid air cells are unopacified.  No evidence of calvarial fracture.  IMPRESSION: Subacute lacunar infarct in the left thalamus.  Encephalomalacic changes in the left temporal lobe related to prior infarction.  Atrophy with small vessel ischemic changes.   Electronically Signed   By: Charline Bills M.D.   On: 11-26-2013 16:04   Dg Chest Port 1 View  11-26-13   CLINICAL DATA:  Chest congestion. Labored breathing. Lethargy. Tachypnea. Decreased oxygen saturations.  EXAM: PORTABLE CHEST - 1 VIEW  COMPARISON:  12/20/2010.  FINDINGS: Airspace opacities noted in the right lung  base consistent with pneumonia.  Lungs are otherwise clear. Cardiac silhouette is mildly enlarged. No mediastinal or hilar masses. No convincing pleural effusion and no pneumothorax.  Bony thorax is demineralized but grossly intact.  IMPRESSION: Right lower lobe airspace opacity consistent with pneumonia given the history.   Electronically Signed   By: Amie Portland M.D.   On: 11-26-13 14:52    ASSESSMENT AND PLAN: Present on Admission:  . Septic shock . HCAP (healthcare-associated pneumonia) . Acute respiratory failure with hypoxia . Acute ischemic stroke . Acute Encephalopathy .Hypernatremia .ARF . Seizure disorder  . Chronic atrial fibrillation . Hypothyroidism  See HPI. Unfortunate 80 year old gentleman who was just discharged from this facility following a CVA, presents today with acute hypoxic respiratory failure, acute encephalopathy, and septic shock from healthcare associated pneumonia with a significant aspiration component. He also has significant worsening of his right-sided weakness, not sure if he has a new acute CVA as well. Electrolytes show significant hypernatremia and acute renal failure. Currently actively dying, agonal breathing, hypoxic, severely hypotensive and essentially unresponsive. After discussion with the family, we have transitioned to full comfort measures. Family aware of critical illness and, impending death. Plan at this time is for full comfort measures, will start morphine infusion,prn morphine/Ativan. Start atropine oral drops and scopolamine patch. Will continue with IV Dilantin for seizure treatment. Anticipate inpatient death. Family aware that we will not be pursuing further lab work, or further radiological studies including MRI drain.  Above noted plan was discussed with spouse,son and daughter they were in agreement.   DVT Prophylaxis: None  Code Status: DNR  Disposition Plan: Anticipate inpatient death   Total time spent for admission  equals 55 minutes.  [facility] Pager [phone]  If 7PM-7AM, please contact night-coverage [email or web address] Password [email or web address] Nov 26, 2013, 5:03 PM

## 2013-12-01 NOTE — ED Notes (Signed)
Patient transported to CT 

## 2013-12-01 NOTE — Progress Notes (Signed)
ANTIBIOTIC CONSULT NOTE - INITIAL  Pharmacy Consult for cefepime Indication: rule out sepsis  Allergies  Allergen Reactions  . Vancomycin Rash    Patient Measurements: Height: 5\' 5"  (165.1 cm) Weight: 175 lb (79.379 kg) IBW/kg (Calculated) : 61.5   Vital Signs: Temp: 104.5 F (40.3 C) (10/25 1420) Temp Source: Rectal (10/25 1420) BP: 122/58 mmHg (10/25 1430) Pulse Rate: 110 (10/25 1430) Intake/Output from previous day:   Intake/Output from this shift:    Labs: No results found for this basename: WBC, HGB, PLT, LABCREA, CREATININE,  in the last 72 hours Estimated Creatinine Clearance: 39.2 ml/min (by C-G formula based on Cr of 1.24). No results found for this basename: VANCOTROUGH, VANCOPEAK, VANCORANDOM, GENTTROUGH, GENTPEAK, GENTRANDOM, TOBRATROUGH, TOBRAPEAK, TOBRARND, AMIKACINPEAK, AMIKACINTROU, AMIKACIN,  in the last 72 hours   Microbiology: No results found for this or any previous visit (from the past 720 hour(s)).  Medical History: Past Medical History  Diagnosis Date  . Arthritis   . Heart murmur   . Hearing loss   . Blood in urine   . 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 Confusion   . Memory loss   . Aphasia   . Easy bruising   . Seizures     last seizure was   . Nocturia   . Hypothyroidism   . Snores   . Atrial fibrillation   . Localization-related (focal) (partial) epilepsy and epileptic syndromes with complex partial seizures, without mention of intractable epilepsy 12/12/2012  . Hypersomnia, unspecified   . Hypothyroidism     Assessment: 89 YOM discharged on 10/21 after a CVA. Today it was noted he was more lethargic and not [facility]. resp sats low- now on non-rebreather. Fever of 104.5 in ED. SCr from discharge on 10/21 was 1.24- est CrCl ~7840mL/min No other antibiotics ordered at this time  Goal of Therapy:  eradication of infection  Plan:  1. Cefepime 1g IV q24h 2. Follow c/s,  clinical progression, renal function, other antibiotic orders  Ronald Roberts, PharmD, BCPS Clinical Pharmacist Pager: [phone] 2014/01/14 2:50 PM

## 2013-12-01 NOTE — Progress Notes (Signed)
Morphine drip discarded in sink. Jacquiline DoeJennifer Zhu RN witness to waste.

## 2013-12-01 NOTE — ED Notes (Signed)
Wife and daughter arrived to room; they confirm to uphold the DNR.

## 2013-12-01 NOTE — Progress Notes (Signed)
Body is transported to morgue.

## 2013-12-01 NOTE — ED Notes (Signed)
Patient taken off of monitor and allowed to rest. Family at the bedside.

## 2013-12-01 NOTE — Progress Notes (Signed)
Wasted IV morphine bag with Arline Aspindy, RN in sink.

## 2013-12-01 NOTE — Discharge Summary (Signed)
Death Summary  Ronald Roberts AOZ:[id number] DOB: 10-16-1922 DOA: 30-Oct-2013  PCP: Lorenda PeckOBERTS, RONALD WAYNE, MD PCP/Office notified: Death summary will be electronically forwarded to PCP.  Admit date: 30-Oct-2013 Date of Death: 30-Oct-2013  Final Diagnoses:  Principal Problem:   Septic shock Active Problems:   Chronic atrial fibrillation   Acute ischemic stroke   Hypothyroidism   Seizure disorder   HCAP (healthcare-associated pneumonia)   Acute respiratory failure with hypoxia   ARF (acute renal failure)   Hypernatremia    History of present illness: Ronald Roberts is a 80 y.o. male with a Past Medical History of recent CVA with expressive aphasia and right-sided weakness just discharged to a local skilled nursing facility, history of focal seizures, atrial fibrillation, history of herpes encephalitis resultant aphasia/memory loss who presented in an unresponsive state with respiratory distress. Patient was just discharged from this facility with a acute CVA. He has a hx of Herpes encephalitis, and resultant aphasia and memory issue. In ED patient was found to be febrile, hypoxic, hypotensive.Further work up revealed HCAP, possible acute CVA, ARF and hypernatremia. Patient was a DO NOT RESUSCITATE, hence not intubated, I was asked to admit this patient for further evaluation. After long d/w family by this MD, patient was transitioned to full comfort measures.   Hospital Course:  Patient's Hospital course lasted for only a few hours. Patient presented with acute hypoxic respiratory failure, acute encephalopathy, and septic shock from healthcare associated pneumonia with a significant aspiration component. He also had significant worsening of his right-sided weakness, not sure if he had a new acute CVA as well. Electrolytes showed significant hypernatremia and acute renal failure.After discussion with the family, we transitioned to full comfort measures.Family was fully aware of critical illness  and, impending death.Patient was started on IV Morphine gtt. Patient subsequently passed at 1809 hrs with family at bedside.Note, there were no plans to pursue further work up including MRI brain or lab work.   SignedJeoffrey Massed:  GHIMIRE,SHANKER  [facility] 30-Oct-2013, 6:19 PM

## 2013-12-01 NOTE — ED Notes (Signed)
[facility] reports normally combative, yells a lot (but does not talk a lot) and uses wheelchair to get around. Yesterday less combative and lethargic. Today more lethargic and not yelling or talking; tachyapnea, resp distress with sats of 75% on up to 4L [place]. Up to 88% on NRB mask at this time. Currently, not talking or opening eyes, but does pull extremities away from staff with care.

## 2013-12-01 NOTE — Progress Notes (Signed)
Pt arrived on the unit with agonal breathing. After about 10 minutes while attempting to obtain vitals pt was found without respirations. No pulse was found, Public librarianJesse RN verified. Ghimire MD made aware.

## 2013-12-01 DEATH — deceased

## 2013-12-11 ENCOUNTER — Ambulatory Visit: Payer: Medicare Other | Admitting: Nurse Practitioner

## 2013-12-19 ENCOUNTER — Ambulatory Visit: Payer: Medicare Other | Admitting: Nurse Practitioner

## 2014-01-01 ENCOUNTER — Ambulatory Visit: Payer: Medicare Other | Admitting: Nurse Practitioner

## 2014-01-14 ENCOUNTER — Other Ambulatory Visit: Payer: Self-pay | Admitting: Neurology

## 2014-01-14 NOTE — Telephone Encounter (Signed)
Chart indicates patient is deceased.

## 2014-11-27 NOTE — Telephone Encounter (Signed)
Error
